# Patient Record
Sex: Female | Born: 1955
Health system: Southern US, Community
[De-identification: ages and names within clinical notes are randomized; demographics above are authoritative.]

## PROBLEM LIST (undated history)

## (undated) DIAGNOSIS — E079 Disorder of thyroid, unspecified: Secondary | ICD-10-CM

## (undated) DIAGNOSIS — K219 Gastro-esophageal reflux disease without esophagitis: Secondary | ICD-10-CM

## (undated) DIAGNOSIS — M8580 Other specified disorders of bone density and structure, unspecified site: Secondary | ICD-10-CM

## (undated) DIAGNOSIS — T7840XA Allergy, unspecified, initial encounter: Secondary | ICD-10-CM

## (undated) DIAGNOSIS — I10 Essential (primary) hypertension: Secondary | ICD-10-CM

## (undated) DIAGNOSIS — L309 Dermatitis, unspecified: Secondary | ICD-10-CM

## (undated) DIAGNOSIS — H8109 Meniere's disease, unspecified ear: Secondary | ICD-10-CM

## (undated) DIAGNOSIS — N951 Menopausal and female climacteric states: Secondary | ICD-10-CM

## (undated) DIAGNOSIS — F32A Depression, unspecified: Secondary | ICD-10-CM

## (undated) DIAGNOSIS — G4700 Insomnia, unspecified: Secondary | ICD-10-CM

## (undated) DIAGNOSIS — B019 Varicella without complication: Secondary | ICD-10-CM

## (undated) DIAGNOSIS — F329 Major depressive disorder, single episode, unspecified: Secondary | ICD-10-CM

## (undated) DIAGNOSIS — E7800 Pure hypercholesterolemia, unspecified: Secondary | ICD-10-CM

## (undated) HISTORY — DX: Menopausal and female climacteric states: N95.1

## (undated) HISTORY — DX: Varicella without complication: B01.9

## (undated) HISTORY — DX: Major depressive disorder, single episode, unspecified: F32.9

## (undated) HISTORY — DX: Dermatitis, unspecified: L30.9

## (undated) HISTORY — DX: Pure hypercholesterolemia, unspecified: E78.00

## (undated) HISTORY — DX: Disorder of thyroid, unspecified: E07.9

## (undated) HISTORY — DX: Meniere's disease, unspecified ear: H81.09

## (undated) HISTORY — DX: Gastro-esophageal reflux disease without esophagitis: K21.9

## (undated) HISTORY — DX: Depression, unspecified: F32.A

## (undated) HISTORY — DX: Essential (primary) hypertension: I10

## (undated) HISTORY — PX: WISDOM TOOTH EXTRACTION: SHX21

## (undated) HISTORY — DX: Other specified disorders of bone density and structure, unspecified site: M85.80

## (undated) HISTORY — DX: Allergy, unspecified, initial encounter: T78.40XA

## (undated) HISTORY — DX: Insomnia, unspecified: G47.00

## (undated) HISTORY — PX: COLONOSCOPY: SHX174

---

## 1998-10-22 ENCOUNTER — Other Ambulatory Visit: Admission: RE | Admit: 1998-10-22 | Discharge: 1998-10-22 | Payer: Self-pay | Admitting: Obstetrics and Gynecology

## 1998-11-18 ENCOUNTER — Encounter: Payer: Self-pay | Admitting: Obstetrics and Gynecology

## 1998-11-22 ENCOUNTER — Ambulatory Visit (HOSPITAL_COMMUNITY): Admission: RE | Admit: 1998-11-22 | Discharge: 1998-11-22 | Payer: Self-pay | Admitting: Obstetrics and Gynecology

## 1998-11-22 HISTORY — PX: TUBAL LIGATION: SHX77

## 1999-10-25 ENCOUNTER — Other Ambulatory Visit: Admission: RE | Admit: 1999-10-25 | Discharge: 1999-10-25 | Payer: Self-pay | Admitting: Obstetrics and Gynecology

## 1999-11-07 ENCOUNTER — Encounter: Payer: Self-pay | Admitting: Endocrinology

## 1999-11-07 ENCOUNTER — Ambulatory Visit (HOSPITAL_COMMUNITY): Admission: RE | Admit: 1999-11-07 | Discharge: 1999-11-07 | Payer: Self-pay | Admitting: Endocrinology

## 1999-12-16 ENCOUNTER — Encounter: Admission: RE | Admit: 1999-12-16 | Discharge: 1999-12-27 | Payer: Self-pay | Admitting: Neurological Surgery

## 2000-11-14 ENCOUNTER — Other Ambulatory Visit: Admission: RE | Admit: 2000-11-14 | Discharge: 2000-11-14 | Payer: Self-pay | Admitting: Obstetrics and Gynecology

## 2000-11-15 ENCOUNTER — Other Ambulatory Visit: Admission: RE | Admit: 2000-11-15 | Discharge: 2000-11-15 | Payer: Self-pay | Admitting: Obstetrics and Gynecology

## 2000-11-15 ENCOUNTER — Encounter (INDEPENDENT_AMBULATORY_CARE_PROVIDER_SITE_OTHER): Payer: Self-pay

## 2001-07-16 ENCOUNTER — Encounter: Admission: RE | Admit: 2001-07-16 | Discharge: 2001-07-16 | Payer: Self-pay | Admitting: Endocrinology

## 2001-07-16 ENCOUNTER — Encounter: Payer: Self-pay | Admitting: Endocrinology

## 2001-11-22 ENCOUNTER — Other Ambulatory Visit: Admission: RE | Admit: 2001-11-22 | Discharge: 2001-11-22 | Payer: Self-pay | Admitting: Obstetrics and Gynecology

## 2002-07-17 ENCOUNTER — Encounter: Payer: Self-pay | Admitting: Endocrinology

## 2002-07-17 ENCOUNTER — Encounter: Admission: RE | Admit: 2002-07-17 | Discharge: 2002-07-17 | Payer: Self-pay | Admitting: Endocrinology

## 2002-11-27 ENCOUNTER — Other Ambulatory Visit: Admission: RE | Admit: 2002-11-27 | Discharge: 2002-11-27 | Payer: Self-pay | Admitting: Obstetrics and Gynecology

## 2003-08-06 ENCOUNTER — Encounter: Admission: RE | Admit: 2003-08-06 | Discharge: 2003-08-06 | Payer: Self-pay | Admitting: Obstetrics and Gynecology

## 2003-10-09 ENCOUNTER — Other Ambulatory Visit: Admission: RE | Admit: 2003-10-09 | Discharge: 2003-10-09 | Payer: Self-pay | Admitting: Obstetrics and Gynecology

## 2004-08-10 ENCOUNTER — Encounter: Admission: RE | Admit: 2004-08-10 | Discharge: 2004-08-10 | Payer: Self-pay | Admitting: Obstetrics and Gynecology

## 2004-10-11 ENCOUNTER — Other Ambulatory Visit: Admission: RE | Admit: 2004-10-11 | Discharge: 2004-10-11 | Payer: Self-pay | Admitting: Addiction Medicine

## 2005-08-28 ENCOUNTER — Encounter: Admission: RE | Admit: 2005-08-28 | Discharge: 2005-08-28 | Payer: Self-pay | Admitting: Obstetrics and Gynecology

## 2005-10-17 ENCOUNTER — Other Ambulatory Visit: Admission: RE | Admit: 2005-10-17 | Discharge: 2005-10-17 | Payer: Self-pay | Admitting: Obstetrics and Gynecology

## 2006-08-29 ENCOUNTER — Encounter: Admission: RE | Admit: 2006-08-29 | Discharge: 2006-08-29 | Payer: Self-pay | Admitting: Obstetrics and Gynecology

## 2006-10-23 ENCOUNTER — Other Ambulatory Visit: Admission: RE | Admit: 2006-10-23 | Discharge: 2006-10-23 | Payer: Self-pay | Admitting: Obstetrics and Gynecology

## 2007-09-02 ENCOUNTER — Encounter: Admission: RE | Admit: 2007-09-02 | Discharge: 2007-09-02 | Payer: Self-pay | Admitting: Obstetrics and Gynecology

## 2007-09-10 ENCOUNTER — Encounter: Admission: RE | Admit: 2007-09-10 | Discharge: 2007-09-10 | Payer: Self-pay | Admitting: Obstetrics and Gynecology

## 2007-10-24 ENCOUNTER — Other Ambulatory Visit: Admission: RE | Admit: 2007-10-24 | Discharge: 2007-10-24 | Payer: Self-pay | Admitting: Obstetrics and Gynecology

## 2008-09-02 ENCOUNTER — Encounter: Admission: RE | Admit: 2008-09-02 | Discharge: 2008-09-02 | Payer: Self-pay | Admitting: Obstetrics and Gynecology

## 2008-09-24 ENCOUNTER — Encounter: Admission: RE | Admit: 2008-09-24 | Discharge: 2008-09-24 | Payer: Self-pay | Admitting: Otolaryngology

## 2008-10-26 ENCOUNTER — Other Ambulatory Visit: Admission: RE | Admit: 2008-10-26 | Discharge: 2008-10-26 | Payer: Self-pay | Admitting: Obstetrics and Gynecology

## 2008-10-26 ENCOUNTER — Ambulatory Visit: Payer: Self-pay | Admitting: Obstetrics and Gynecology

## 2008-10-26 ENCOUNTER — Encounter: Payer: Self-pay | Admitting: Obstetrics and Gynecology

## 2008-11-13 ENCOUNTER — Ambulatory Visit: Payer: Self-pay | Admitting: Obstetrics and Gynecology

## 2009-09-03 ENCOUNTER — Encounter: Admission: RE | Admit: 2009-09-03 | Discharge: 2009-09-03 | Payer: Self-pay | Admitting: Obstetrics and Gynecology

## 2009-11-02 ENCOUNTER — Ambulatory Visit: Payer: Self-pay | Admitting: Obstetrics and Gynecology

## 2009-11-02 ENCOUNTER — Other Ambulatory Visit: Admission: RE | Admit: 2009-11-02 | Discharge: 2009-11-02 | Payer: Self-pay | Admitting: Obstetrics and Gynecology

## 2009-12-21 ENCOUNTER — Ambulatory Visit: Payer: Self-pay | Admitting: Obstetrics and Gynecology

## 2010-04-26 ENCOUNTER — Ambulatory Visit: Payer: Self-pay | Admitting: Obstetrics and Gynecology

## 2010-08-01 ENCOUNTER — Other Ambulatory Visit: Payer: Self-pay | Admitting: Obstetrics and Gynecology

## 2010-08-01 DIAGNOSIS — Z1239 Encounter for other screening for malignant neoplasm of breast: Secondary | ICD-10-CM

## 2010-09-06 ENCOUNTER — Ambulatory Visit
Admission: RE | Admit: 2010-09-06 | Discharge: 2010-09-06 | Disposition: A | Discharge status: Home / Self Care | Payer: 59 | Source: Ambulatory Visit | Attending: Obstetrics and Gynecology | Admitting: Obstetrics and Gynecology

## 2010-09-06 DIAGNOSIS — Z1239 Encounter for other screening for malignant neoplasm of breast: Secondary | ICD-10-CM

## 2010-11-07 ENCOUNTER — Encounter (INDEPENDENT_AMBULATORY_CARE_PROVIDER_SITE_OTHER): Payer: 59 | Admitting: Obstetrics and Gynecology

## 2010-11-07 ENCOUNTER — Other Ambulatory Visit (HOSPITAL_COMMUNITY)
Admission: RE | Admit: 2010-11-07 | Discharge: 2010-11-07 | Disposition: A | Discharge status: Home / Self Care | Payer: 59 | Source: Ambulatory Visit | Attending: Obstetrics and Gynecology | Admitting: Obstetrics and Gynecology

## 2010-11-07 ENCOUNTER — Other Ambulatory Visit: Payer: Self-pay | Admitting: Obstetrics and Gynecology

## 2010-11-07 DIAGNOSIS — Z3049 Encounter for surveillance of other contraceptives: Secondary | ICD-10-CM

## 2010-11-07 DIAGNOSIS — M949 Disorder of cartilage, unspecified: Secondary | ICD-10-CM

## 2010-11-07 DIAGNOSIS — M899 Disorder of bone, unspecified: Secondary | ICD-10-CM

## 2010-11-07 DIAGNOSIS — Z124 Encounter for screening for malignant neoplasm of cervix: Secondary | ICD-10-CM | POA: Insufficient documentation

## 2011-08-08 ENCOUNTER — Other Ambulatory Visit: Payer: Self-pay | Admitting: Obstetrics and Gynecology

## 2011-08-08 DIAGNOSIS — Z1231 Encounter for screening mammogram for malignant neoplasm of breast: Secondary | ICD-10-CM

## 2011-09-08 ENCOUNTER — Ambulatory Visit
Admission: RE | Admit: 2011-09-08 | Discharge: 2011-09-08 | Disposition: A | Discharge status: Home / Self Care | Payer: Managed Care, Other (non HMO) | Source: Ambulatory Visit | Attending: Obstetrics and Gynecology | Admitting: Obstetrics and Gynecology

## 2011-09-08 DIAGNOSIS — Z1231 Encounter for screening mammogram for malignant neoplasm of breast: Secondary | ICD-10-CM

## 2011-11-01 ENCOUNTER — Encounter: Payer: Self-pay | Admitting: Obstetrics and Gynecology

## 2011-11-01 DIAGNOSIS — F329 Major depressive disorder, single episode, unspecified: Secondary | ICD-10-CM | POA: Insufficient documentation

## 2011-11-01 DIAGNOSIS — N951 Menopausal and female climacteric states: Secondary | ICD-10-CM | POA: Insufficient documentation

## 2011-11-01 DIAGNOSIS — F32A Depression, unspecified: Secondary | ICD-10-CM | POA: Insufficient documentation

## 2011-11-01 DIAGNOSIS — G4700 Insomnia, unspecified: Secondary | ICD-10-CM | POA: Insufficient documentation

## 2011-11-01 DIAGNOSIS — M8580 Other specified disorders of bone density and structure, unspecified site: Secondary | ICD-10-CM | POA: Insufficient documentation

## 2011-11-08 ENCOUNTER — Ambulatory Visit (INDEPENDENT_AMBULATORY_CARE_PROVIDER_SITE_OTHER): Payer: Managed Care, Other (non HMO) | Admitting: Obstetrics and Gynecology

## 2011-11-08 ENCOUNTER — Encounter: Payer: Self-pay | Admitting: Obstetrics and Gynecology

## 2011-11-08 VITALS — BP 124/76 | Ht 61.0 in | Wt 156.0 lb

## 2011-11-08 DIAGNOSIS — Z01419 Encounter for gynecological examination (general) (routine) without abnormal findings: Secondary | ICD-10-CM

## 2011-11-08 DIAGNOSIS — E079 Disorder of thyroid, unspecified: Secondary | ICD-10-CM | POA: Insufficient documentation

## 2011-11-08 DIAGNOSIS — E7800 Pure hypercholesterolemia, unspecified: Secondary | ICD-10-CM | POA: Insufficient documentation

## 2011-11-08 DIAGNOSIS — E785 Hyperlipidemia, unspecified: Secondary | ICD-10-CM | POA: Insufficient documentation

## 2011-11-08 DIAGNOSIS — E039 Hypothyroidism, unspecified: Secondary | ICD-10-CM | POA: Insufficient documentation

## 2011-11-08 NOTE — Progress Notes (Signed)
Patient came to see me today for her annual GYN exam. Her hot flashes have subsided and she really has one. She does have some vaginal dryness but does not require intervention. She is not sexually active. She is up-to-date on mammograms and colonoscopy. She does her lab through PCP.  she has low bone mass without an elevated FRAX risk. She is due for follow bone density this year. She has had no fractures. She has no vaginal bleeding. She has no pelvic pain.  Physical examination: Kennon Portela present. HEENT within normal limits. Neck: Thyroid not large. No masses. Supraclavicular nodes: not enlarged. Breasts: Examined in both sitting and lying  position. No skin changes and no masses. Abdomen: Soft no guarding rebound or masses or hernia. Pelvic: External: Within normal limits. BUS: Within normal limits. Vaginal:within normal limits. Fair  estrogen effect. No evidence of cystocele rectocele or enterocele. Cervix: clean. Uterus: Normal size and shape. Adnexa: No masses. Rectovaginal exam: Confirmatory and negative. Extremities: Within normal limits.  Assessment: #1. Atrophic vaginitis-mild #2. Osteopenia  Plan: Continue yearly mammograms. Bone density this year.

## 2011-11-09 LAB — URINALYSIS W MICROSCOPIC + REFLEX CULTURE
Bacteria, UA: NONE SEEN
Bilirubin Urine: NEGATIVE
Casts: NONE SEEN
Crystals: NONE SEEN
Glucose, UA: NEGATIVE mg/dL
Hgb urine dipstick: NEGATIVE
Ketones, ur: NEGATIVE mg/dL
Leukocytes, UA: NEGATIVE
Nitrite: NEGATIVE
Protein, ur: NEGATIVE mg/dL
pH: 7.5 (ref 5.0–8.0)

## 2011-12-20 ENCOUNTER — Other Ambulatory Visit: Payer: Self-pay | Admitting: Obstetrics and Gynecology

## 2011-12-20 DIAGNOSIS — M8580 Other specified disorders of bone density and structure, unspecified site: Secondary | ICD-10-CM

## 2011-12-26 ENCOUNTER — Ambulatory Visit (INDEPENDENT_AMBULATORY_CARE_PROVIDER_SITE_OTHER): Payer: Managed Care, Other (non HMO)

## 2011-12-26 DIAGNOSIS — M899 Disorder of bone, unspecified: Secondary | ICD-10-CM

## 2011-12-26 DIAGNOSIS — M8580 Other specified disorders of bone density and structure, unspecified site: Secondary | ICD-10-CM

## 2012-01-02 ENCOUNTER — Encounter: Payer: Self-pay | Admitting: Obstetrics and Gynecology

## 2012-07-12 ENCOUNTER — Other Ambulatory Visit: Payer: Self-pay | Admitting: Obstetrics and Gynecology

## 2012-09-03 ENCOUNTER — Other Ambulatory Visit: Payer: Self-pay | Admitting: Gynecology

## 2012-09-03 DIAGNOSIS — Z1231 Encounter for screening mammogram for malignant neoplasm of breast: Secondary | ICD-10-CM

## 2012-09-26 ENCOUNTER — Ambulatory Visit
Admission: RE | Admit: 2012-09-26 | Discharge: 2012-09-26 | Disposition: A | Discharge status: Home / Self Care | Payer: Managed Care, Other (non HMO) | Source: Ambulatory Visit | Attending: Gynecology | Admitting: Gynecology

## 2012-09-26 DIAGNOSIS — Z1231 Encounter for screening mammogram for malignant neoplasm of breast: Secondary | ICD-10-CM

## 2012-11-08 ENCOUNTER — Encounter: Payer: Self-pay | Admitting: Gynecology

## 2012-11-08 ENCOUNTER — Ambulatory Visit (INDEPENDENT_AMBULATORY_CARE_PROVIDER_SITE_OTHER): Payer: Managed Care, Other (non HMO) | Admitting: Gynecology

## 2012-11-08 VITALS — BP 126/74 | Ht 61.25 in | Wt 166.0 lb

## 2012-11-08 DIAGNOSIS — M949 Disorder of cartilage, unspecified: Secondary | ICD-10-CM

## 2012-11-08 DIAGNOSIS — M8580 Other specified disorders of bone density and structure, unspecified site: Secondary | ICD-10-CM

## 2012-11-08 DIAGNOSIS — Z01419 Encounter for gynecological examination (general) (routine) without abnormal findings: Secondary | ICD-10-CM

## 2012-11-08 DIAGNOSIS — Z1159 Encounter for screening for other viral diseases: Secondary | ICD-10-CM

## 2012-11-08 DIAGNOSIS — M899 Disorder of bone, unspecified: Secondary | ICD-10-CM

## 2012-11-08 NOTE — Patient Instructions (Signed)

## 2012-11-08 NOTE — Progress Notes (Signed)
Bianca Lynch Dec 09, 1955 [id number]   History:    57 y.o.  for annual gyn exam with no complaints today. Patient has never been on hormone replacement therapy. She has been in menopause for several years. Dr. Wylene Simmer is her primary physician who has been doing her lab work and treating her for hyperlipidemia and hypothyroidism. Patient would know prior history of abnormal Pap smear. Last Pap smear was normal in 2012. Her colonoscopy was reported to be normal 8 years ago. Her last bone density study in 2013 with osteopenia lowest T score in the right femoral neck -1.1.  Past medical history,surgical history, family history and social history were all reviewed and documented in the EPIC chart.  Gynecologic History No LMP recorded. Patient is postmenopausal. Contraception: post menopausal status and tubal ligation Last Pap: 2012. Results were: normal Last mammogram: 2014. Results were: normal  Obstetric History OB History   Grav Para Term Preterm Abortions TAB SAB Ect Mult Living   0                ROS: A ROS was performed and pertinent positives and negatives are included in the history.  GENERAL: No fevers or chills. HEENT: No change in vision, no earache, sore throat or sinus congestion. NECK: No pain or stiffness. CARDIOVASCULAR: No chest pain or pressure. No palpitations. PULMONARY: No shortness of breath, cough or wheeze. GASTROINTESTINAL: No abdominal pain, nausea, vomiting or diarrhea, melena or bright red blood per rectum. GENITOURINARY: No urinary frequency, urgency, hesitancy or dysuria. MUSCULOSKELETAL: No joint or muscle pain, no back pain, no recent trauma. DERMATOLOGIC: No rash, no itching, no lesions. ENDOCRINE: No polyuria, polydipsia, no heat or cold intolerance. No recent change in weight. HEMATOLOGICAL: No anemia or easy bruising or bleeding. NEUROLOGIC: No headache, seizures, numbness, tingling or weakness. PSYCHIATRIC: No depression, no loss of interest in normal activity or  change in sleep pattern.     Exam: chaperone present  BP 126/74  Ht 5' 1.25" (1.556 m)  Wt 166 lb (75.297 kg)  BMI 31.1 kg/m2  Body mass index is 31.1 kg/(m^2).  General appearance : Well developed well nourished female. No acute distress HEENT: Neck supple, trachea midline, no carotid bruits, no thyroidmegaly Lungs: Clear to auscultation, no rhonchi or wheezes, or rib retractions  Heart: Regular rate and rhythm, no murmurs or gallops Breast:Examined in sitting and supine position were symmetrical in appearance, no palpable masses or tenderness,  no skin retraction, no nipple inversion, no nipple discharge, no skin discoloration, no axillary or supraclavicular lymphadenopathy Abdomen: no palpable masses or tenderness, no rebound or guarding Extremities: no edema or skin discoloration or tenderness  Pelvic:  Bartholin, Urethra, Skene Glands: Within normal limits             Vagina: No gross lesions or discharge  Cervix: No gross lesions or discharge  Uterus  anteverted, normal size, shape and consistency, non-tender and mobile  Adnexa  Without masses or tenderness  Anus and perineum  normal   Rectovaginal  normal sphincter tone without palpated masses or tenderness             Hemoccult primary physician will be provided her with cards     Assessment/Plan:  57 y.o. female for annual exam who is postmenopausal with no vasomotor symptoms no prior history of hormone replacement therapy. Patient not sexually active. She was instructed take calcium and vitamin D for osteoporosis prevention. She is due for bone density study next year. She will check with  her primary physician in reference to her Tdap status. I've given her prescription for her to obtain her shingles vaccine.  New CDC guidelines is recommending patients be tested once in her lifetime for hepatitis C antibody who were born between [birth year] through 1965. This was discussed with the patient today and has agreed to be tested  today.    Ok Edwards MD, 12:21 PM 11/08/2012

## 2012-11-09 LAB — HEPATITIS C ANTIBODY: HCV Ab: NEGATIVE

## 2013-08-22 ENCOUNTER — Other Ambulatory Visit: Payer: Self-pay

## 2013-08-22 DIAGNOSIS — Z1231 Encounter for screening mammogram for malignant neoplasm of breast: Secondary | ICD-10-CM

## 2013-10-10 ENCOUNTER — Ambulatory Visit
Admission: RE | Admit: 2013-10-10 | Discharge: 2013-10-10 | Disposition: A | Discharge status: Home / Self Care | Payer: Managed Care, Other (non HMO) | Source: Ambulatory Visit

## 2013-10-10 DIAGNOSIS — Z1231 Encounter for screening mammogram for malignant neoplasm of breast: Secondary | ICD-10-CM

## 2013-11-11 ENCOUNTER — Ambulatory Visit (INDEPENDENT_AMBULATORY_CARE_PROVIDER_SITE_OTHER): Payer: Managed Care, Other (non HMO) | Admitting: Gynecology

## 2013-11-11 ENCOUNTER — Encounter: Payer: Self-pay | Admitting: Gynecology

## 2013-11-11 ENCOUNTER — Other Ambulatory Visit (HOSPITAL_COMMUNITY)
Admission: RE | Admit: 2013-11-11 | Discharge: 2013-11-11 | Disposition: A | Discharge status: Home / Self Care | Payer: Managed Care, Other (non HMO) | Source: Ambulatory Visit | Attending: Gynecology | Admitting: Gynecology

## 2013-11-11 VITALS — BP 130/82 | Ht 60.75 in | Wt 178.3 lb

## 2013-11-11 DIAGNOSIS — R87810 Cervical high risk human papillomavirus (HPV) DNA test positive: Secondary | ICD-10-CM | POA: Insufficient documentation

## 2013-11-11 DIAGNOSIS — M949 Disorder of cartilage, unspecified: Secondary | ICD-10-CM

## 2013-11-11 DIAGNOSIS — N951 Menopausal and female climacteric states: Secondary | ICD-10-CM

## 2013-11-11 DIAGNOSIS — M899 Disorder of bone, unspecified: Secondary | ICD-10-CM

## 2013-11-11 DIAGNOSIS — Z01419 Encounter for gynecological examination (general) (routine) without abnormal findings: Secondary | ICD-10-CM

## 2013-11-11 DIAGNOSIS — Z23 Encounter for immunization: Secondary | ICD-10-CM

## 2013-11-11 DIAGNOSIS — Z1151 Encounter for screening for human papillomavirus (HPV): Secondary | ICD-10-CM | POA: Insufficient documentation

## 2013-11-11 DIAGNOSIS — Z124 Encounter for screening for malignant neoplasm of cervix: Secondary | ICD-10-CM | POA: Insufficient documentation

## 2013-11-11 DIAGNOSIS — M8580 Other specified disorders of bone density and structure, unspecified site: Secondary | ICD-10-CM

## 2013-11-11 DIAGNOSIS — Z780 Asymptomatic menopausal state: Secondary | ICD-10-CM

## 2013-11-11 NOTE — Patient Instructions (Addendum)
Tetanus, Diphtheria (Td) Vaccine What You Need to Know WHY GET VACCINATED? Tetanus  and diphtheria are very serious diseases. They are rare in the [place] today, but people who do become infected often have severe complications. Td vaccine is used to protect adolescents and adults from both of these diseases. Both tetanus and diphtheria are infections caused by bacteria. Diphtheria spreads from person to person through coughing or sneezing. Tetanus-causing bacteria enter the body through cuts, scratches, or wounds. TETANUS (Lockjaw) causes painful muscle tightening and stiffness, usually all over the body.  It can lead to tightening of muscles in the head and neck so you can't open your mouth, swallow, or sometimes even breathe. Tetanus kills about 1 out of every 5 people who are infected. DIPHTHERIA can cause a thick coating to form in the back of the throat.  It can lead to breathing problems, paralysis, heart failure, and death. Before vaccines, the [place] saw as many as 200,000 cases a year of diphtheria and hundreds of cases of tetanus. Since vaccination began, cases of both diseases have dropped by about 99%. TD VACCINE Td vaccine can protect adolescents and adults from tetanus and diphtheria. Td is usually given as a booster dose every 10 years but it can also be given earlier after a severe and dirty wound or burn. Your doctor can give you more information. Td may safely be given at the same time as other vaccines. SOME PEOPLE SHOULD NOT GET THIS VACCINE  If you ever had a life-threatening allergic reaction after a dose of any tetanus or diphtheria containing vaccine, OR if you have a severe allergy to any part of this vaccine, you should not get Td. Tell your doctor if you have any severe allergies.  Talk to your doctor if you:  have epilepsy or another nervous system problem,  had severe pain or swelling after any vaccine containing diphtheria or tetanus,  ever had  Guillain Barr Syndrome (GBS),  aren't feeling well on the day the shot is scheduled. RISKS OF A VACCINE REACTION With a vaccine, like any medicine, there is a chance of side effects. These are usually mild and go away on their own. Serious side effects are also possible, but are very rare. Most people who get Td vaccine do not have any problems with it. Mild Problems  following Td (Did not interfere with activities)  Pain where the shot was given (about 8 people in 10)  Redness or swelling where the shot was given (about 1 person in 3)  Mild fever (about 1 person in 15)  Headache or Tiredness (uncommon) Moderate Problems following Td (Interfered with activities, but did not require medical attention)  Fever over 102 F (38.9 C) (rare) Severe Problems  following Td (Unable to perform usual activities; required medical attention)  Swelling, severe pain, bleeding, or redness in the arm where the shot was given (rare). Problems that could happen after any vaccine:  Brief fainting spells can happen after any medical procedure, including vaccination. Sitting or lying down for about 15 minutes can help prevent fainting, and injuries caused by a fall. Tell your doctor if you feel dizzy, or have vision changes or ringing in the ears.  Severe shoulder pain and reduced range of motion in the arm where a shot was given can happen, very rarely, after a vaccination.  Severe allergic reactions from a vaccine are very rare, estimated at less than 1 in a million doses. If one were to occur, it would   usually be within a few minutes to a few hours after the vaccination. WHAT IF THERE IS A SERIOUS REACTION? What should I look for?  Look for anything that concerns you, such as signs of a severe allergic reaction, very high fever, or behavior changes. Signs of a severe allergic reaction can include hives, swelling of the face and throat, difficulty breathing, a fast heartbeat, dizziness, and  weakness. These would usually start a few minutes to a few hours after the vaccination. What should I do?  If you think it is a severe allergic reaction or other emergency that can't wait, call 911 or get the person to the nearest hospital. Otherwise, call your doctor.  Afterward, the reaction should be reported to the Vaccine Adverse Event Reporting System (VAERS). Your doctor might file this report, or, you can do it yourself through the VAERS website or by calling (820)486-8570. VAERS is only for reporting reactions. They do not give medical advice. THE NATIONAL VACCINE INJURY COMPENSATION PROGRAM The National Vaccine Injury Compensation Program (VICP) is a federal program that was created to compensate people who may have been injured by certain vaccines. Persons who believe they may have been injured by a vaccine can learn about the program and about filing a claim by calling (906)828-9843 or visiting the [facility] website. HOW CAN I LEARN MORE?  Ask your doctor.  Contact your local or state health department.  Contact the Centers for Disease Control and Prevention (CDC):  Call 516-597-4044 (1-800-CDC-INFO)  Visit CDC's vaccines website CDC Td Vaccine Interim VIS (08/13/12) Document Released: 04/23/2006 Document Revised: 10/21/2012 Document Reviewed: 10/16/2012 [facility] Patient Information 2014 [place], [place]. Bone Densitometry Bone densitometry is a special X-ray that measures your bone density and can be used to help predict your risk of bone fractures. This test is used to determine bone mineral content and density to diagnose osteoporosis. Osteoporosis is the loss of bone that may cause the bone to become weak. Osteoporosis commonly occurs in women entering menopause. However, it may be found in men and in people with other diseases. PREPARATION FOR TEST No preparation necessary. WHO SHOULD BE TESTED?  All women older than 58.  Postmenopausal women (50 to 19) with risk factors  for osteoporosis.  People with a previous fracture caused by normal activities.  People with a small body frame (less than 127 poundsor a body mass index [BMI] of less than 21).  People who have a parent with a hip fracture or history of osteoporosis.  People who smoke.  People who have rheumatoid arthritis.  Anyone who engages in excessive alcohol use (more than 3 drinks most days).  Women who experience early menopause. WHEN SHOULD YOU BE RETESTED? Current guidelines suggest that you should wait at least 2 years before doing a bone density test again if your first test was normal.Recent studies indicated that women with normal bone density may be able to wait a few years before needing to repeat a bone density test. You should discuss this with your caregiver.  NORMAL FINDINGS   Normal: less than standard deviation below normal (greater than -1).  Osteopenia: 1 to 2.5 standard deviations below normal (-1 to -2.5).  Osteoporosis: greater than 2.5 standard deviations below normal (less than -2.5). Test results are reported as a "T score" and a "Z score."The T score is a number that compares your bone density with the bone density of healthy, young women.The Z score is a number that compares your bone density with the scores of  women who are the same age, gender, and race.  Ranges for normal findings may vary among different laboratories and hospitals. You should always check with your doctor after having lab work or other tests done to discuss the meaning of your test results and whether your values are considered within normal limits. MEANING OF TEST  Your caregiver will go over the test results with you and discuss the importance and meaning of your results, as well as treatment options and the need for additional tests if necessary. OBTAINING THE TEST RESULTS It is your responsibility to obtain your test results. Ask the lab or department performing the test when and how you will  get your results. Document Released: 07/18/2004 Document Revised: 09/18/2011 Document Reviewed: 08/10/2010 [facility] Patient Information 2014 [place].

## 2013-11-11 NOTE — Progress Notes (Signed)
Bianca Lynch [birth year] [id number]   History:    58 y.o.  for annual gyn exam with no complaints today.Patient has never been on hormone replacement therapy. She has been in menopause for several years.  Patient is now seeing a new PCP in [place], [place] who has been doing her blood work. Patient with no prior history of abnormal Pap smear. Last Pap smear was normal 2004. Her last colonoscopy was normal 9 years ago.Her last bone density study in 2013 with osteopenia lowest T score in the right femoral neck -1.1. Patient received her she will fax the last year. Patient to receive the Tdap vaccine today. Patient with prior tubal sterilization procedure. Patient having no menses or vasomotor symptoms.   Past medical history,surgical history, family history and social history were all reviewed and documented in the EPIC chart.  Gynecologic History No LMP recorded. Patient is postmenopausal. Contraception: post menopausal status and tubal ligation Last Pap: 2012. Results were: normal Last mammogram:  2015. Results were: normal  Obstetric History OB History  Gravida Para Term Preterm AB SAB TAB Ectopic Multiple Living  0                  ROS: A ROS was performed and pertinent positives and negatives are included in the history.  GENERAL: No fevers or chills. HEENT: No change in vision, no earache, sore throat or sinus congestion. NECK: No pain or stiffness. CARDIOVASCULAR: No chest pain or pressure. No palpitations. PULMONARY: No shortness of breath, cough or wheeze. GASTROINTESTINAL: No abdominal pain, nausea, vomiting or diarrhea, melena or bright red blood per rectum. GENITOURINARY: No urinary frequency, urgency, hesitancy or dysuria. MUSCULOSKELETAL: No joint or muscle pain, no back pain, no recent trauma. DERMATOLOGIC: No rash, no itching, no lesions. ENDOCRINE: No polyuria, polydipsia, no heat or cold intolerance. No recent change in weight. HEMATOLOGICAL: No anemia or easy  bruising or bleeding. NEUROLOGIC: No headache, seizures, numbness, tingling or weakness. PSYCHIATRIC: No depression, no loss of interest in normal activity or change in sleep pattern.     Exam: chaperone present  BP 130/82  Ht 5' 0.75" (1.543 m)  Wt 178 lb 4.8 oz (80.876 kg)  BMI 33.97 kg/m2  Body mass index is 33.97 kg/(m^2).  General appearance : Well developed well nourished female. No acute distress HEENT: Neck supple, trachea midline, no carotid bruits, no thyroidmegaly Lungs: Clear to auscultation, no rhonchi or wheezes, or rib retractions  Heart: Regular rate and rhythm, no murmurs or gallops Breast:Examined in sitting and supine position were symmetrical in appearance, no palpable masses or tenderness,  no skin retraction, no nipple inversion, no nipple discharge, no skin discoloration, no axillary or supraclavicular lymphadenopathy Abdomen: no palpable masses or tenderness, no rebound or guarding Extremities: no edema or skin discoloration or tenderness  Pelvic:  Bartholin, Urethra, Skene Glands: Within normal limits             Vagina: No gross lesions or discharge  Cervix: No gross lesions or discharge  Uterus  anteverted, normal size, shape and consistency, non-tender and mobile  Adnexa  Without masses or tenderness  Anus and perineum  normal   Rectovaginal  normal sphincter tone without palpated masses or tenderness             Hemoccult cards provided     Assessment/Plan:  58 y.o. female for annual exam postmenopausal with no visual motor symptoms. PCP doing her blood work. Patient due for colonoscopy next year. A requisition  was provided for her to schedule her bone density study here in our office in the next few weeks and also to bring with her at the same time her fecal Hemoccult cards for testing. She was reminded on the importance of monthly breast exams. She received the Tdap vaccine today. Pap smear was done today in accordance to new guidelines.  Note: This  dictation was prepared with  Dragon/digital dictation along withSmart phrase technology. Any transcriptional errors that result from this process are unintentional.   Terrance Mass MD, 5:13 PM 11/11/2013

## 2013-12-09 ENCOUNTER — Other Ambulatory Visit: Payer: Self-pay | Admitting: Gynecology

## 2013-12-09 ENCOUNTER — Ambulatory Visit (INDEPENDENT_AMBULATORY_CARE_PROVIDER_SITE_OTHER): Payer: Managed Care, Other (non HMO)

## 2013-12-09 DIAGNOSIS — Z780 Asymptomatic menopausal state: Secondary | ICD-10-CM

## 2013-12-09 DIAGNOSIS — N951 Menopausal and female climacteric states: Secondary | ICD-10-CM

## 2013-12-09 DIAGNOSIS — Z13820 Encounter for screening for osteoporosis: Secondary | ICD-10-CM

## 2013-12-09 DIAGNOSIS — M8580 Other specified disorders of bone density and structure, unspecified site: Secondary | ICD-10-CM

## 2013-12-29 ENCOUNTER — Other Ambulatory Visit: Payer: Managed Care, Other (non HMO) | Admitting: Anesthesiology

## 2013-12-29 DIAGNOSIS — Z1211 Encounter for screening for malignant neoplasm of colon: Secondary | ICD-10-CM

## 2014-02-16 ENCOUNTER — Ambulatory Visit: Payer: Managed Care, Other (non HMO) | Admitting: Podiatry

## 2014-02-16 ENCOUNTER — Encounter: Payer: Self-pay | Admitting: Podiatry

## 2014-02-16 VITALS — BP 128/70 | HR 69 | Resp 12

## 2014-02-16 DIAGNOSIS — L600 Ingrowing nail: Secondary | ICD-10-CM

## 2014-02-16 NOTE — Patient Instructions (Signed)

## 2014-02-16 NOTE — Progress Notes (Signed)
Subjective:     Patient ID: Bianca Lynch, female   DOB: June 03, 1956, 58 y.o.   MRN: [id number]  HPI patient presents stating I have an ingrown toenail on my right big toe that I tried to trim and soak and I am not able to get better   Review of Systems  All other systems reviewed and are negative.      Objective:   Physical Exam  Nursing note and vitals reviewed. Constitutional: She is oriented to person, place, and time.  Cardiovascular: Intact distal pulses.   Musculoskeletal: Normal range of motion.  Neurological: She is oriented to person, place, and time.  Skin: Skin is warm.   Neurovascular status found to be intact with range of motion subtalar midtarsal joint within normal limits and muscle strength adequate. Patient is found to have incurvated ingrown toenail right big nail lateral side that is very painful when pressed. Digits are well-perfused and arch height was found to be normal    Assessment:     Ingrown toenail with incurvation lateral border right hallux    Plan:     H&P reviewed with patient and condition explained. I've recommended correction and I discussed with her the surgery to correct this and reviewed complications. Patient wants surgery and today I infiltrated 60 mg Xylocaine Marcaine mixture remove the lateral border exposed matrix and apply chemical phenol 3 applications 30 seconds followed by alcohol lavaged and sterile dressing. Patient was given instructions on soaks and reappoint

## 2014-02-16 NOTE — Progress Notes (Signed)
   Subjective:    Patient ID: Bianca Lynch, female    DOB: 02-Jun-1956, 58 y.o.   MRN: [id number]  HPI PT STATED RT FOOT GREAT TOENAIL IS BEEN SORE FOR ABOUT 1 MONTH. THE TOE IS BEEN THE SAME BUT NOT GETTING WORSE. THE TOE GET AGGRAVATED BY PRESSURE AND TRIED TO USED PEROXIDE AND NEOSPORIN BUT NO HELP.   Review of Systems  HENT: Positive for hearing loss.   All other systems reviewed and are negative.      Objective:   Physical Exam        Assessment & Plan:

## 2014-10-07 ENCOUNTER — Other Ambulatory Visit: Payer: Self-pay

## 2014-10-07 DIAGNOSIS — Z1231 Encounter for screening mammogram for malignant neoplasm of breast: Secondary | ICD-10-CM

## 2014-10-14 ENCOUNTER — Ambulatory Visit: Payer: Managed Care, Other (non HMO)

## 2014-10-21 ENCOUNTER — Ambulatory Visit
Admission: RE | Admit: 2014-10-21 | Discharge: 2014-10-21 | Disposition: A | Discharge status: Home / Self Care | Payer: Managed Care, Other (non HMO) | Source: Ambulatory Visit

## 2014-10-21 DIAGNOSIS — Z1231 Encounter for screening mammogram for malignant neoplasm of breast: Secondary | ICD-10-CM

## 2014-11-13 ENCOUNTER — Ambulatory Visit (INDEPENDENT_AMBULATORY_CARE_PROVIDER_SITE_OTHER): Payer: Managed Care, Other (non HMO) | Admitting: Gynecology

## 2014-11-13 ENCOUNTER — Other Ambulatory Visit (HOSPITAL_COMMUNITY)
Admission: RE | Admit: 2014-11-13 | Discharge: 2014-11-13 | Disposition: A | Discharge status: Home / Self Care | Payer: Managed Care, Other (non HMO) | Source: Ambulatory Visit | Attending: Gynecology | Admitting: Gynecology

## 2014-11-13 ENCOUNTER — Encounter: Payer: Self-pay | Admitting: Gynecology

## 2014-11-13 VITALS — BP 136/88 | Ht 60.5 in | Wt 180.0 lb

## 2014-11-13 DIAGNOSIS — R87810 Cervical high risk human papillomavirus (HPV) DNA test positive: Secondary | ICD-10-CM | POA: Diagnosis present

## 2014-11-13 DIAGNOSIS — Z780 Asymptomatic menopausal state: Secondary | ICD-10-CM | POA: Diagnosis not present

## 2014-11-13 DIAGNOSIS — Z1151 Encounter for screening for human papillomavirus (HPV): Secondary | ICD-10-CM | POA: Diagnosis present

## 2014-11-13 DIAGNOSIS — Z01419 Encounter for gynecological examination (general) (routine) without abnormal findings: Secondary | ICD-10-CM | POA: Insufficient documentation

## 2014-11-13 DIAGNOSIS — N942 Vaginismus: Secondary | ICD-10-CM | POA: Diagnosis not present

## 2014-11-13 NOTE — Patient Instructions (Signed)

## 2014-11-13 NOTE — Progress Notes (Signed)
Bianca Lynch 1955/08/11 [id number]   History:    59 y.o.  for annual gyn exam with no complaints today. Patient is menopausal has never been on hormone replacement therapy and no past history of any abnormal Pap smear until last year whereby her Pap smear was as follows:  Normal  Ancillary Testing HPV 16,18/45 Genotyping NEGATIVE for HPV 16 & 18/45 Completed by [id number] on 2013-11-17 HPV High Risk **DETECTED**  Patient's PCP is in [facility]. Patient 2011 her bone density study had demonstrated mild osteopenia but subsequent bone density studies have been normal including last year. Patient is now overdue for her colonoscopy her last one was 10 years ago and was reported to be normal.   Past medical history,surgical history, family history and social history were all reviewed and documented in the EPIC chart.  Gynecologic History No LMP recorded. Patient is postmenopausal. Contraception: post menopausal status Last Pap: 2015. Results were: normal Last mammo2016. Results were: normal  Obstetric History OB History  Gravida Para Term Preterm AB SAB TAB Ectopic Multiple Living  0                  ROS: A ROS was performed and pertinent positives and negatives are included in the history.  GENERAL: No fevers or chills. HEENT: No change in vision, no earache, sore throat or sinus congestion. NECK: No pain or stiffness. CARDIOVASCULAR: No chest pain or pressure. No palpitations. PULMONARY: No shortness of breath, cough or wheeze. GASTROINTESTINAL: No abdominal pain, nausea, vomiting or diarrhea, melena or bright red blood per rectum. GENITOURINARY: No urinary frequency, urgency, hesitancy or dysuria. MUSCULOSKELETAL: No joint or muscle pain, no back pain, no recent trauma. DERMATOLOGIC: No rash, no itching, no lesions. ENDOCRINE: No polyuria, polydipsia, no heat or cold intolerance. No recent change in weight. HEMATOLOGICAL: No anemia or easy bruising or bleeding.  NEUROLOGIC: No headache, seizures, numbness, tingling or weakness. PSYCHIATRIC: No depression, no loss of interest in normal activity or change in sleep pattern.     Exam: chaperone present  BP 136/88 mmHg  Ht 5' 0.5" (1.537 m)  Wt 180 lb (81.647 kg)  BMI 34.56 kg/m2  Body mass index is 34.56 kg/(m^2).  General appearance : Well developed well nourished female. No acute distress HEENT: Eyes: no retinal hemorrhage or exudates,  Neck supple, trachea midline, no carotid bruits, no thyroidmegaly Lungs: Clear to auscultation, no rhonchi or wheezes, or rib retractions  Heart: Regular rate and rhythm, no murmurs or gallops Breast:Examined in sitting and supine position were symmetrical in appearance, no palpable masses or tenderness,  no skin retraction, no nipple inversion, no nipple discharge, no skin discoloration, no axillary or supraclavicular lymphadenopathy Abdomen: no palpable masses or tenderness, no rebound or guarding Extremities: no edema or skin discoloration or tenderness  Pelvic:  Bartholin, Urethra, Skene Glands: Within normal limits             Vagina: No gross lesions or discharge  Cervix: No gross lesions or discharge  Uterus: Limited due to patient's vaginismus an abdominal girth              Adnexa: Same as above   Anus and perineum  normal   Rectovaginal  normal sphincter tone without palpated masses or tenderness             HemocCards provided    Assessment/Plan:  59 y.o. female for annual exam postmenopausal no prior history of HRT doing well. I have asked the patient  to return back to the office next week for an ultrasound for more thorough evaluation of her adnexa and uterus as a result of her abdominal girth/overweight and vaginismus. Pap smear with HPV screening was done today. She was reminded to do her monthly breast exam. We discussed importance of calcium vitamin D and regular exercise for osteoporosis prevention. She will need her bone density study next  year. She was reminded to bring to the office the fecal Hemoccult cards for testing. I've given the name of community gastroenterologist for her to schedule an appointment so she is overdue for her next colonoscopy.  Terrance Mass MD, 2:27 PM 11/13/2014

## 2014-11-17 LAB — CYTOLOGY - PAP

## 2014-11-18 ENCOUNTER — Encounter: Payer: Self-pay | Admitting: Gynecology

## 2014-11-18 ENCOUNTER — Other Ambulatory Visit: Payer: Self-pay | Admitting: Gynecology

## 2014-11-18 ENCOUNTER — Ambulatory Visit (INDEPENDENT_AMBULATORY_CARE_PROVIDER_SITE_OTHER): Payer: Managed Care, Other (non HMO)

## 2014-11-18 ENCOUNTER — Ambulatory Visit (INDEPENDENT_AMBULATORY_CARE_PROVIDER_SITE_OTHER): Payer: Managed Care, Other (non HMO) | Admitting: Gynecology

## 2014-11-18 VITALS — BP 140/86

## 2014-11-18 DIAGNOSIS — N8320 Unspecified ovarian cysts: Secondary | ICD-10-CM

## 2014-11-18 DIAGNOSIS — N839 Noninflammatory disorder of ovary, fallopian tube and broad ligament, unspecified: Secondary | ICD-10-CM

## 2014-11-18 DIAGNOSIS — N8331 Acquired atrophy of ovary: Secondary | ICD-10-CM

## 2014-11-18 DIAGNOSIS — Z780 Asymptomatic menopausal state: Secondary | ICD-10-CM

## 2014-11-18 DIAGNOSIS — R87618 Other abnormal cytological findings on specimens from cervix uteri: Secondary | ICD-10-CM | POA: Insufficient documentation

## 2014-11-18 DIAGNOSIS — N83319 Acquired atrophy of ovary, unspecified side: Secondary | ICD-10-CM

## 2014-11-18 DIAGNOSIS — N838 Other noninflammatory disorders of ovary, fallopian tube and broad ligament: Secondary | ICD-10-CM

## 2014-11-18 DIAGNOSIS — N942 Vaginismus: Secondary | ICD-10-CM

## 2014-11-18 DIAGNOSIS — N83201 Unspecified ovarian cyst, right side: Secondary | ICD-10-CM

## 2014-11-18 HISTORY — DX: Other abnormal cytological findings on specimens from cervix uteri: R87.618

## 2014-11-18 NOTE — Patient Instructions (Addendum)
Ovarian Cyst An ovarian cyst is a fluid-filled sac that forms on an ovary. The ovaries are small organs that produce eggs in women. Various types of cysts can form on the ovaries. Most are not cancerous. Many do not cause problems, and they often go away on their own. Some may cause symptoms and require treatment. Common types of ovarian cysts include:  Functional cysts--These cysts may occur every month during the menstrual cycle. This is normal. The cysts usually go away with the next menstrual cycle if the woman does not get pregnant. Usually, there are no symptoms with a functional cyst.  Endometrioma cysts--These cysts form from the tissue that lines the uterus. They are also called "chocolate cysts" because they become filled with blood that turns brown. This type of cyst can cause pain in the lower abdomen during intercourse and with your menstrual period.  Cystadenoma cysts--This type develops from the cells on the outside of the ovary. These cysts can get very big and cause lower abdomen pain and pain with intercourse. This type of cyst can twist on itself, cut off its blood supply, and cause severe pain. It can also easily rupture and cause a lot of pain.  Dermoid cysts--This type of cyst is sometimes found in both ovaries. These cysts may contain different kinds of body tissue, such as skin, teeth, hair, or cartilage. They usually do not cause symptoms unless they get very big.  Theca lutein cysts--These cysts occur when too much of a certain hormone (human chorionic gonadotropin) is produced and overstimulates the ovaries to produce an egg. This is most common after procedures used to assist with the conception of a baby (in vitro fertilization). CAUSES   Fertility drugs can cause a condition in which multiple large cysts are formed on the ovaries. This is called ovarian hyperstimulation syndrome.  A condition called polycystic ovary syndrome can cause hormonal imbalances that can lead to  nonfunctional ovarian cysts. SIGNS AND SYMPTOMS  Many ovarian cysts do not cause symptoms. If symptoms are present, they may include:  Pelvic pain or pressure.  Pain in the lower abdomen.  Pain during sexual intercourse.  Increasing girth (swelling) of the abdomen.  Abnormal menstrual periods.  Increasing pain with menstrual periods.  Stopping having menstrual periods without being pregnant. DIAGNOSIS  These cysts are commonly found during a routine or annual pelvic exam. Tests may be ordered to find out more about the cyst. These tests may include:  Ultrasound.  X-ray of the pelvis.  CT scan.  MRI.  Blood tests. TREATMENT  Many ovarian cysts go away on their own without treatment. Your health care provider may want to check your cyst regularly for 2-3 months to see if it changes. For women in menopause, it is particularly important to monitor a cyst closely because of the higher rate of ovarian cancer in menopausal women. When treatment is needed, it may include any of the following:  A procedure to drain the cyst (aspiration). This may be done using a long needle and ultrasound. It can also be done through a laparoscopic procedure. This involves using a thin, lighted tube with a tiny camera on the end (laparoscope) inserted through a small incision.  Surgery to remove the whole cyst. This may be done using laparoscopic surgery or an open surgery involving a larger incision in the lower abdomen.  Hormone treatment or birth control pills. These methods are sometimes used to help dissolve a cyst. HOME CARE INSTRUCTIONS   Only take over-the-counter   or prescription medicines as directed by your health care provider.  Follow up with your health care provider as directed.  Get regular pelvic exams and Pap tests. SEEK MEDICAL CARE IF:   Your periods are late, irregular, or painful, or they stop.  Your pelvic pain or abdominal pain does not go away.  Your abdomen becomes  larger or swollen.  You have pressure on your bladder or trouble emptying your bladder completely.  You have pain during sexual intercourse.  You have feelings of fullness, pressure, or discomfort in your stomach.  You lose weight for no apparent reason.  You feel generally ill.  You become constipated.  You lose your appetite.  You develop acne.  You have an increase in body and facial hair.  You are gaining weight, without changing your exercise and eating habits.  You think you are pregnant. SEEK IMMEDIATE MEDICAL CARE IF:   You have increasing abdominal pain.  You feel sick to your stomach (nauseous), and you throw up (vomit).  You develop a fever that comes on suddenly.  You have abdominal pain during a bowel movement.  Your menstrual periods become heavier than usual. MAKE SURE YOU:  Understand these instructions.  Will watch your condition.  Will get help right away if you are not doing well or get worse. Document Released: 06/26/2005 Document Revised: 07/01/2013 Document Reviewed: 03/03/2013 [facility] Patient Information 2015 [place], [place]. This information is not intended to replace advice given to you by your health care provider. Make sure you discuss any questions you have with your health care provider. Colposcopy Colposcopy is a procedure to examine your cervix and vagina, or the area around the outside of your vagina, for abnormalities or signs of disease. The procedure is done using a lighted microscope called a colposcope. Tissue samples may be collected during the colposcopy if your health care provider finds any unusual cells. A colposcopy may be done if a woman has:  An abnormal Pap test. A Pap test is a medical test done to evaluate cells that are on the surface of the cervix.  A Pap test result that is suggestive of human papillomavirus (HPV). This virus can cause genital warts and is linked to the development of cervical cancer.  A sore on her  cervix and the results of a Pap test were normal.  Genital warts on the cervix or in or around the outside of the vagina.  A mother who took the drug diethylstilbestrol (DES) while pregnant.  Painful intercourse.  Vaginal bleeding, especially after sexual intercourse. LET [facility] CARE PROVIDER KNOW ABOUT:  Any allergies you have.  All medicines you are taking, including vitamins, herbs, eye drops, creams, and over-the-counter medicines.  Previous problems you or members of your family have had with the use of anesthetics.  Any blood disorders you have.  Previous surgeries you have had.  Medical conditions you have. RISKS AND COMPLICATIONS Generally, a colposcopy is a safe procedure. However, as with any procedure, complications can occur. Possible complications include:  Bleeding.  Infection.  Missed lesions. BEFORE THE PROCEDURE   Tell your health care provider if you have your menstrual period. A colposcopy typically is not done during menstruation.  For 24 hours before the colposcopy, do not:  [place].  Use tampons.  Use medicines, creams, or suppositories in the vagina.  Have sexual intercourse. PROCEDURE  During the procedure, you will be lying on your back with your feet in foot rests (stirrups). A warm metal or plastic  instrument (speculum) will be placed in your vagina to keep it open and to allow the health care provider to see the cervix. The colposcope will be placed outside the vagina. It will be used to magnify and examine the cervix, vagina, and the area around the outside of the vagina. A small amount of liquid solution will be placed on the area that is to be viewed. This solution will make it easier to see the abnormal cells. Your health care provider will use tools to suck out mucus and cells from the canal of the cervix. Then he or she will record the location of the abnormal areas. If a biopsy is done during the procedure, a medicine will usually be  given to numb the area (local anesthetic). You may feel mild pain or cramping while the biopsy is done. After the procedure, tissue samples collected during the biopsy will be sent to a lab for analysis. AFTER THE PROCEDURE  You will be given instructions on when to follow up with your health care provider for your test results. It is important to keep your appointment. Document Released: 09/16/2002 Document Revised: 02/26/2013 Document Reviewed: 01/23/2013 [facility] Patient Information 2015 [place], [place]. This information is not intended to replace advice given to you by your health care provider. Make sure you discuss any questions you have with your CA-125 Tumor Marker CA 125 is a tumor marker that is used to help monitor the course of ovarian or endometrial cancer. PREPARATION FOR TEST No preparation is necessary. NORMAL FINDINGS Adults: 0-35 units/mL (0-35 kilounits)/L Ranges for normal findings may vary among different laboratories and hospitals. You should always check with your doctor after having lab work or other tests done to discuss the meaning of your test results and whether your values are considered within normal limits. MEANING OF TEST  Your caregiver will go over the test results with you and discuss the importance and meaning of your results, as well as treatment options and the need for additional tests if necessary. OBTAINING THE TEST RESULTS It is your responsibility to obtain your test results. Ask the lab or department performing the test when and how you will get your results. Document Released: 07/18/2004 Document Revised: 09/18/2011 Document Reviewed: 06/03/2008 [facility] Patient Information 2015 [place], [place]. This information is not intended to replace advice given to you by your health care provider. Make sure you discuss any questions you have with your health care provider. health care provider.

## 2014-11-18 NOTE — Progress Notes (Signed)
   Patient's a 59 year old who was seen in the office on May 6 of her annual gynecological examination patient was totally asymptomatic but due to her vaginismus and difficult pelvic exam she was instructed to come to the office today for an ultrasound. Also review of her record indicated that she has had abnormal Pap smears as follows:  2015: Pap: Normal Ancillary Testing HPV 16,18/45 Genotyping NEGATIVE for HPV 16 & 18/45 Completed by [id number] on 2013-11-17 HPV High Risk **DETECTED**  2016: Pap: Normal High-risk HPV detected  Ultrasound: Uterus measured 5.8 x 3.1 x 2.2 cm with endometrial stripe of 1.4 mm. A right solid echogenic focus measuring 13 x 10 x 10 mm avascular was noted. Arterial blood flow was evidence of both right and left ovary. Left ovary was normal. Ovaries were atrophic. No fluid in the cul-de-sac.  Assessment/plan: #1 Pap smear 2015 2016 normal but HPV detected. Patient will return back to the office next week for colposcopic evaluation. This recent Pap smear will going to request and HPV subtype and we'll discuss with the patient returns for further evaluation. #2 incidental finding of an echogenic focus in the right ovary measured 13 x 10 x 10 mm avascular was cleaned with CA 125 today and recommend follow-up ultrasound in 6 months.

## 2014-11-19 LAB — CA 125: CA 125: 8 U/mL (ref <35)

## 2014-11-27 ENCOUNTER — Encounter: Payer: Self-pay | Admitting: Gynecology

## 2014-11-27 ENCOUNTER — Ambulatory Visit (INDEPENDENT_AMBULATORY_CARE_PROVIDER_SITE_OTHER): Payer: Managed Care, Other (non HMO) | Admitting: Gynecology

## 2014-11-27 ENCOUNTER — Encounter (HOSPITAL_COMMUNITY): Payer: Self-pay | Admitting: Neurology

## 2014-11-27 ENCOUNTER — Emergency Department (HOSPITAL_COMMUNITY)
Admission: EM | Admit: 2014-11-27 | Discharge: 2014-11-27 | Disposition: A | Discharge status: Home / Self Care | Payer: Managed Care, Other (non HMO) | Attending: Emergency Medicine | Admitting: Emergency Medicine

## 2014-11-27 VITALS — BP 120/80

## 2014-11-27 DIAGNOSIS — S0992XA Unspecified injury of nose, initial encounter: Secondary | ICD-10-CM | POA: Insufficient documentation

## 2014-11-27 DIAGNOSIS — Z87891 Personal history of nicotine dependence: Secondary | ICD-10-CM | POA: Diagnosis not present

## 2014-11-27 DIAGNOSIS — R87618 Other abnormal cytological findings on specimens from cervix uteri: Secondary | ICD-10-CM

## 2014-11-27 DIAGNOSIS — Z8669 Personal history of other diseases of the nervous system and sense organs: Secondary | ICD-10-CM | POA: Insufficient documentation

## 2014-11-27 DIAGNOSIS — Z8742 Personal history of other diseases of the female genital tract: Secondary | ICD-10-CM | POA: Insufficient documentation

## 2014-11-27 DIAGNOSIS — Z79899 Other long term (current) drug therapy: Secondary | ICD-10-CM | POA: Diagnosis not present

## 2014-11-27 DIAGNOSIS — S0181XA Laceration without foreign body of other part of head, initial encounter: Secondary | ICD-10-CM | POA: Insufficient documentation

## 2014-11-27 DIAGNOSIS — Y998 Other external cause status: Secondary | ICD-10-CM | POA: Diagnosis not present

## 2014-11-27 DIAGNOSIS — E039 Hypothyroidism, unspecified: Secondary | ICD-10-CM | POA: Diagnosis not present

## 2014-11-27 DIAGNOSIS — F329 Major depressive disorder, single episode, unspecified: Secondary | ICD-10-CM | POA: Diagnosis not present

## 2014-11-27 DIAGNOSIS — Y9289 Other specified places as the place of occurrence of the external cause: Secondary | ICD-10-CM | POA: Insufficient documentation

## 2014-11-27 DIAGNOSIS — M8580 Other specified disorders of bone density and structure, unspecified site: Secondary | ICD-10-CM | POA: Insufficient documentation

## 2014-11-27 DIAGNOSIS — W01198A Fall on same level from slipping, tripping and stumbling with subsequent striking against other object, initial encounter: Secondary | ICD-10-CM | POA: Insufficient documentation

## 2014-11-27 DIAGNOSIS — E780 Pure hypercholesterolemia: Secondary | ICD-10-CM | POA: Insufficient documentation

## 2014-11-27 DIAGNOSIS — Y9389 Activity, other specified: Secondary | ICD-10-CM | POA: Insufficient documentation

## 2014-11-27 MED ORDER — HYDROCODONE-ACETAMINOPHEN 5-325 MG PO TABS: 1.0000 | ORAL_TABLET | Freq: Four times a day (QID) | ORAL | Status: DC | PRN

## 2014-11-27 MED ORDER — LIDOCAINE-EPINEPHRINE (PF) 2 %-1:200000 IJ SOLN
10.0000 mL | Freq: Once | INTRAMUSCULAR | Status: AC
  Administered 2014-11-27: 10 mL via INTRADERMAL
  Filled 2014-11-27: qty 20

## 2014-11-27 NOTE — ED Notes (Signed)
Pt reports today tripped and fell hitting head on the grill. No LOC. Has 3 inch laceration to forehead. No blood thinners. Pt is a x 4

## 2014-11-27 NOTE — Progress Notes (Signed)
   Patient presented to the office today for colposcopic evaluation. Her Pap smear history is as follows:  2015: Pap: Normal Ancillary Testing HPV 16,18/45 Genotyping NEGATIVE for HPV 16 & 18/45 Completed by [id number] on 2013-11-17 HPV High Risk **DETECTED**  2016: Pap: Normal High-risk HPV detected  Patient was counseled and underwent make complete detail colposcopic evaluation to include the external genitalia, perineum, perirectal region with no lesions seen even after application of acetic acid. The speculum was introduced into the vagina is systematic inspection vagina, fornix and cervix did not demonstrate any lesions. Since patient is postmenopausal or transformation so was difficult to evaluate him with endocervical speculum. A vigorous ECC was obtained and was cemented for histological evaluation.  Assessment/plan: Patient with 2 years back to back with normal Pap smear but HPV noted although subtype 16, 18, and 45 were not detected.. Negative colposcopy today. ECC obtained results pending at time of this dictation. If ECC is negative will follow-up with Pap smear 1 year according to the ASCCP guidelines.

## 2014-11-27 NOTE — ED Provider Notes (Signed)
CSN: [id number]     Arrival date & time 11/27/14  1244 History  This chart was scribed for non-physician practitioner, Domenic Moras, PA-C,working with Daleen Bo, MD, by Marlowe Kays, ED Scribe. This patient was seen in room TR01C/TR01C and the patient's care was started at 2:45 PM.  Chief Complaint  Patient presents with  . Laceration   The history is provided by the patient and medical records. No language interpreter was used.    HPI Comments:  Bianca Lynch is a 59 y.o. female who presents to the Emergency Department complaining of a laceration to her forehead secondary to tripping and falling down her back steps PTA. She states she tripped over her own feet and her forehead came into contact with the leg of the grill. Pt was wearing glasses at the time of fall and reports soreness where they came into contact with the grill leg. She reports significant bleeding that has since resolved. She reports applying a towel to the area with pressure to treat the bleeding. Denies modifying factors. Denies LOC, CP, SOB, visual changes, hip pain, knee pain, ankle pain, neck pain, abdominal pain, nausea or vomiting. Denies anticoagulant therapy. Pt reports her last tetanus vaccination was 11/11/13.  Past Medical History  Diagnosis Date  . Menopausal symptom     HOT FLASHES  . Osteopenia   . Depression   . Insomnia   . Elevated cholesterol   . Thyroid disease     Hypothyroid   Past Surgical History  Procedure Laterality Date  . Tubal ligation  11/22/1998    [facility]/DR. GOTTSEGEN   Family History  Problem Relation Age of Onset  . Hypertension Mother   . Heart disease Mother   . Cancer Mother     UTERINE  . Heart disease Father   . Breast cancer Paternal Aunt     age 82   History  Substance Use Topics  . Smoking status: Former Smoker    Quit date: 11/09/1999  . Smokeless tobacco: Never Used  . Alcohol Use: 8.4 oz/week    14 Standard drinks or equivalent per week     Comment: WINE  WITH DINNER    OB History    Gravida Para Term Preterm AB TAB SAB Ectopic Multiple Living   0              Review of Systems  Eyes: Negative for visual disturbance.  Respiratory: Negative for shortness of breath.   Cardiovascular: Negative for chest pain.  Gastrointestinal: Negative for nausea, vomiting and abdominal pain.  Musculoskeletal: Negative for arthralgias and neck pain.  Skin: Positive for wound.  Neurological: Negative for syncope and numbness.    Allergies  Review of patient's allergies indicates no known allergies.  Home Medications   Prior to Admission medications   Medication Sig Start Date End Date Taking? Authorizing Provider  atorvastatin (LIPITOR) 20 MG tablet Take 20 mg by mouth daily.    Historical Provider, MD  cetirizine (ZYRTEC) 10 MG tablet Take 10 mg by mouth daily.    Historical Provider, MD  citalopram (CELEXA) 10 MG tablet Take 10 mg by mouth daily.    Historical Provider, MD  levothyroxine (SYNTHROID, LEVOTHROID) 112 MCG tablet Take 112 mcg by mouth daily.    Historical Provider, MD  Multiple Vitamins-Minerals (CENTRUM SILVER PO) Take by mouth.    Historical Provider, MD  Omega-3 Fatty Acids (FISH OIL PO) Take by mouth.    Historical Provider, MD  terconazole (TERAZOL 3) 0.8 %  vaginal cream USE 1 APPLICATORFUL VAGINALLY AT BEDTIME FOR 3 DAYS Patient not taking: Reported on 11/27/2014 07/12/12   Huel Cote, NP  vitamin D, CHOLECALCIFEROL, 400 UNITS tablet Take 400 Units by mouth daily.    Historical Provider, MD   Triage Vitals: BP 152/72 mmHg  Pulse 63  Temp(Src) 97.9 F (36.6 C) (Oral)  Resp 18  Ht 5\' 1"  (1.549 m)  Wt 175 lb (79.379 kg)  BMI 33.08 kg/m2  SpO2 100% Physical Exam  Constitutional: She is oriented to person, place, and time. She appears well-developed and well-nourished.  HENT:  Head: Normocephalic.  Right Ear: Tympanic membrane normal.  Left Ear: Tympanic membrane normal.  Nose: No nasal septal hematoma.  No malocclusion.  3 cm laceration to mid forehead vertically. No active bleeding. No foreign object noted. No crepitus. Tenderness noted to bridge of nose.  Eyes: EOM are normal.  Neck: Normal range of motion.  Cardiovascular: Normal rate.   Pulmonary/Chest: Effort normal.  Musculoskeletal: Normal range of motion.  Neurological: She is alert and oriented to person, place, and time.  Skin: Skin is warm and dry.  Psychiatric: She has a normal mood and affect. Her behavior is normal.  Nursing note and vitals reviewed.   ED Course  Procedures (including critical care time) DIAGNOSTIC STUDIES: Oxygen Saturation is 100% on RA, normal by my interpretation.   COORDINATION OF CARE: 2:45 PM- Will clean and suture wound. Discussed option of head CT and pt and I felt that it is not necessary at this time. Pt aware there's a chance of missing occult fracture without imaging.  Pt understand signs to return if condition worsen.  Pt verbalizes understanding and agrees to plan.  LACERATION REPAIR PROCEDURE NOTE The patient's identification was confirmed and consent was obtained. This procedure was performed by Domenic Moras, PA-C at 3:00 PM. Site: mid forehead Sterile procedures observed Anesthetic used (type and amt): Lidocaine 2% with Epinephrine (2 mLs) Suture type/size: 5-0 Prolene Length: 3 cm # of Sutures: 7 Technique: simple, interrupted Complexity: simple Antibx ointment applied Tetanus UTD Site anesthetized, irrigated with NS, explored without evidence of foreign body, wound well approximated, site covered with dry, sterile dressing.  Patient tolerated procedure well without complications. Instructions for care discussed verbally and patient provided with additional written instructions for homecare and f/u.  Medications  lidocaine-EPINEPHrine (XYLOCAINE W/EPI) 2 %-1:200000 (PF) injection 10 mL (10 mLs Intradermal Given by Other 11/27/14 1454)   Labs Review Labs Reviewed - No data to display  Imaging  Review No results found.   EKG Interpretation None      MDM   Final diagnoses:  Forehead laceration, initial encounter    BP 164/97 mmHg  Pulse 58  Temp(Src) 97.4 F (36.3 C) (Oral)  Resp 18  Ht 5\' 1"  (1.549 m)  Wt 175 lb (79.379 kg)  BMI 33.08 kg/m2  SpO2 100%   I personally performed the services described in this documentation, which was scribed in my presence. The recorded information has been reviewed and is accurate.    Domenic Moras, PA-C 11/27/14 1533  Daleen Bo, MD 11/27/14 [phone]

## 2014-11-27 NOTE — Discharge Instructions (Signed)
Please follow up with your doctor or urgent care in 5 days to have your sutures removed.    Facial Laceration  A facial laceration is a cut on the face. These injuries can be painful and cause bleeding. Lacerations usually heal quickly, but they need special care to reduce scarring. DIAGNOSIS  Your health care provider will take a medical history, ask for details about how the injury occurred, and examine the wound to determine how deep the cut is. TREATMENT  Some facial lacerations may not require closure. Others may not be able to be closed because of an increased risk of infection. The risk of infection and the chance for successful closure will depend on various factors, including the amount of time since the injury occurred. The wound may be cleaned to help prevent infection. If closure is appropriate, pain medicines may be given if needed. Your health care provider will use stitches (sutures), wound glue (adhesive), or skin adhesive strips to repair the laceration. These tools bring the skin edges together to allow for faster healing and a better cosmetic outcome. If needed, you may also be given a tetanus shot. HOME CARE INSTRUCTIONS  Only take over-the-counter or prescription medicines as directed by your health care provider.  Follow your health care provider's instructions for wound care. These instructions will vary depending on the technique used for closing the wound. For Sutures:  Keep the wound clean and dry.   If you were given a bandage (dressing), you should change it at least once a day. Also change the dressing if it becomes wet or dirty, or as directed by your health care provider.   Wash the wound with soap and water 2 times a day. Rinse the wound off with water to remove all soap. Pat the wound dry with a clean towel.   After cleaning, apply a thin layer of the antibiotic ointment recommended by your health care provider. This will help prevent infection and keep the  dressing from sticking.   You may shower as usual after the first 24 hours. Do not soak the wound in water until the sutures are removed.   Get your sutures removed as directed by your health care provider. With facial lacerations, sutures should usually be taken out after 4-5 days to avoid stitch marks.   Wait a few days after your sutures are removed before applying any makeup. For Skin Adhesive Strips:  Keep the wound clean and dry.   Do not get the skin adhesive strips wet. You may bathe carefully, using caution to keep the wound dry.   If the wound gets wet, pat it dry with a clean towel.   Skin adhesive strips will fall off on their own. You may trim the strips as the wound heals. Do not remove skin adhesive strips that are still stuck to the wound. They will fall off in time.  For Wound Adhesive:  You may briefly wet your wound in the shower or bath. Do not soak or scrub the wound. Do not swim. Avoid periods of heavy sweating until the skin adhesive has fallen off on its own. After showering or bathing, gently pat the wound dry with a clean towel.   Do not apply liquid medicine, cream medicine, ointment medicine, or makeup to your wound while the skin adhesive is in place. This may loosen the film before your wound is healed.   If a dressing is placed over the wound, be careful not to apply tape directly over  the skin adhesive. This may cause the adhesive to be pulled off before the wound is healed.   Avoid prolonged exposure to sunlight or tanning lamps while the skin adhesive is in place.  The skin adhesive will usually remain in place for 5-10 days, then naturally fall off the skin. Do not pick at the adhesive film.  After Healing: Once the wound has healed, cover the wound with sunscreen during the day for 1 full year. This can help minimize scarring. Exposure to ultraviolet light in the first year will darken the scar. It can take 1-2 years for the scar to lose its  redness and to heal completely.  SEEK IMMEDIATE MEDICAL CARE IF:  You have redness, pain, or swelling around the wound.   You see ayellowish-white fluid (pus) coming from the wound.   You have chills or a fever.  MAKE SURE YOU:  Understand these instructions.  Will watch your condition.  Will get help right away if you are not doing well or get worse. Document Released: 08/03/2004 Document Revised: 04/16/2013 Document Reviewed: 02/06/2013 [facility] Patient Information 2015 [place], [place]. This information is not intended to replace advice given to you by your health care provider. Make sure you discuss any questions you have with your health care provider.

## 2014-12-02 ENCOUNTER — Telehealth: Payer: Self-pay | Admitting: *Deleted

## 2014-12-02 ENCOUNTER — Other Ambulatory Visit: Payer: Managed Care, Other (non HMO) | Admitting: Anesthesiology

## 2014-12-02 DIAGNOSIS — Z1211 Encounter for screening for malignant neoplasm of colon: Secondary | ICD-10-CM | POA: Diagnosis not present

## 2014-12-02 NOTE — Telephone Encounter (Signed)
Pt called and left message in traige to schedule OV to have snitches removed here. Pt had forehead laceration was seen at ER on 11/27/14. I called patient back and left on her voicemail to make OV with PCP to be removed.

## 2014-12-23 ENCOUNTER — Encounter: Payer: Self-pay | Admitting: Gastroenterology

## 2015-02-09 ENCOUNTER — Ambulatory Visit (AMBULATORY_SURGERY_CENTER): Payer: Self-pay | Admitting: *Deleted

## 2015-02-09 VITALS — Ht 60.5 in | Wt 179.0 lb

## 2015-02-09 DIAGNOSIS — Z1211 Encounter for screening for malignant neoplasm of colon: Secondary | ICD-10-CM

## 2015-02-09 MED ORDER — SUPREP BOWEL PREP KIT 17.5-3.13-1.6 GM/177ML PO SOLN: 1.0000 | Freq: Once | ORAL | Status: DC

## 2015-02-09 NOTE — Progress Notes (Signed)
No Eggs or soy products.  No anesthesia problems.  No diet medication.  No Oxygen.

## 2015-02-17 ENCOUNTER — Telehealth: Payer: Self-pay | Admitting: *Deleted

## 2015-02-17 NOTE — Telephone Encounter (Signed)
Pt called for a recommendation for PCP. Pt [place] close to [place]. Gave her the name of [facility] with names of providers there. KW CMA

## 2015-02-19 NOTE — Addendum Note (Signed)
Addended by: [place] Bailiff D on: 02/19/2015 03:22 PM   Modules accepted: Orders

## 2015-02-23 ENCOUNTER — Ambulatory Visit (AMBULATORY_SURGERY_CENTER): Payer: 59 | Admitting: Gastroenterology

## 2015-02-23 ENCOUNTER — Encounter: Payer: Self-pay | Admitting: Gastroenterology

## 2015-02-23 VITALS — BP 112/68 | HR 50 | Temp 97.7°F | Resp 14 | Ht 60.0 in | Wt 179.0 lb

## 2015-02-23 DIAGNOSIS — Z1211 Encounter for screening for malignant neoplasm of colon: Secondary | ICD-10-CM

## 2015-02-23 DIAGNOSIS — D125 Benign neoplasm of sigmoid colon: Secondary | ICD-10-CM

## 2015-02-23 MED ORDER — SODIUM CHLORIDE 0.9 % IV SOLN: 500.0000 mL | INTRAVENOUS | Status: DC

## 2015-02-23 NOTE — Patient Instructions (Signed)
YOU HAD AN ENDOSCOPIC PROCEDURE TODAY AT [facility]:   Refer to the procedure report that was given to you for any specific questions about what was found during the examination.  If the procedure report does not answer your questions, please call your gastroenterologist to clarify.  If you requested that your care partner not be given the details of your procedure findings, then the procedure report has been included in a sealed envelope for you to review at your convenience later.  YOU SHOULD EXPECT: Some feelings of bloating in the abdomen. Passage of more gas than usual.  Walking can help get rid of the air that was put into your GI tract during the procedure and reduce the bloating. If you had a lower endoscopy (such as a colonoscopy or flexible sigmoidoscopy) you may notice spotting of blood in your stool or on the toilet paper. If you underwent a bowel prep for your procedure, you may not have a normal bowel movement for a few days.  Please Note:  You might notice some irritation and congestion in your nose or some drainage.  This is from the oxygen used during your procedure.  There is no need for concern and it should clear up in a day or so.  SYMPTOMS TO REPORT IMMEDIATELY:   Following lower endoscopy (colonoscopy or flexible sigmoidoscopy):  Excessive amounts of blood in the stool  Significant tenderness or worsening of abdominal pains  Swelling of the abdomen that is new, acute  Fever of 100F or higher   For urgent or emergent issues, a gastroenterologist can be reached at any hour by calling [phone].   DIET: Your first meal following the procedure should be a small meal and then it is ok to progress to your normal diet. Heavy or fried foods are harder to digest and may make you feel nauseous or bloated.  Likewise, meals heavy in dairy and vegetables can increase bloating.  Drink plenty of fluids but you should avoid alcoholic beverages for 24  hours.  ACTIVITY:  You should plan to take it easy for the rest of today and you should NOT DRIVE or use heavy machinery until tomorrow (because of the sedation medicines used during the test).    FOLLOW UP: Our staff will call the number listed on your records the next business day following your procedure to check on you and address any questions or concerns that you may have regarding the information given to you following your procedure. If we do not reach you, we will leave a message.  However, if you are feeling well and you are not experiencing any problems, there is no need to return our call.  We will assume that you have returned to your regular daily activities without incident.  If any biopsies were taken you will be contacted by phone or by letter within the next 1-3 weeks.  Please call us at [phone] if you have not heard about the biopsies in 3 weeks.    SIGNATURES/CONFIDENTIALITY: You and/or your care partner have signed paperwork which will be entered into your electronic medical record.  These signatures attest to the fact that that the information above on your After Visit Summary has been reviewed and is understood.  Full responsibility of the confidentiality of this discharge information lies with you and/or your care -partner.  Polyps/ Hemorrhoid handout given Await pathology report

## 2015-02-23 NOTE — Op Note (Addendum)
[place]  [address] [place], [address]   COLONOSCOPY PROCEDURE REPORT  PATIENT: Bianca Lynch, Bianca Lynch  MR#: [id number] BIRTHDATE: 05-31-1956 , 1  yrs. old GENDER: female ENDOSCOPIST: Ladene Artist, MD, [facility] REFERRED BY:  Alma Friendly, NP  Ri PROCEDURE DATE:  02/23/2015 PROCEDURE:   Colonoscopy, screening and Colonoscopy with snare polypectomy First Screening Colonoscopy - Avg.  risk and is 50 yrs.  old or older Yes.  Prior Negative Screening - Now for repeat screening. N/A  History of Adenoma - Now for follow-up colonoscopy & has been > or = to 3 yrs.  N/A  Polyps removed today? Yes ASA CLASS:   Class II INDICATIONS:Screening for colonic neoplasia and Colorectal Neoplasm Risk Assessment for this procedure is average risk. MEDICATIONS: Monitored anesthesia care, Propofol 230 mg IV, and Lidocaine 150 mg IV DESCRIPTION OF PROCEDURE:   After the risks benefits and alternatives of the procedure were thoroughly explained, informed consent was obtained.  The digital rectal exam revealed external hemorrhoids.   The [id number]  endoscope was introduced through the anus and advanced to the cecum, which was identified by both the appendix and ileocecal valve. No adverse events experienced.   The quality of the prep was excellent.  (Suprep was used)  The instrument was then slowly withdrawn as the colon was fully examined. Estimated blood loss is zero unless otherwise noted in this procedure report.    COLON FINDINGS: Three sessile polyps measuring 5 mm in size were found in the sigmoid colon.  Polypectomies were performed with a cold snare.  The resection was complete, the polyp tissue was completely retrieved and sent to histology.   The examination was otherwise normal.  Retroflexed views revealed no abnormalities. The time to cecum = 1.9 Withdrawal time = 11.4   The scope was withdrawn and the procedure completed. COMPLICATIONS: There were no  immediate complications.  ENDOSCOPIC IMPRESSION: 1.   Three sessile polyps in the sigmoid colon; polypectomies performed with a cold snare 2.   Small external hemorrhoids  RECOMMENDATIONS: 1.  Await pathology results 2.  Repeat colonoscopy in 5 years if polyp(s) adenomatous; otherwise 10 years  eSigned:  Ladene Artist, MD, [facility] 02/23/2015 8:37 AM Revised: 02/23/2015 8:37 AM

## 2015-02-23 NOTE — Progress Notes (Signed)
Report to PACU, RN, vss, BBS= Clear.  

## 2015-02-23 NOTE — Progress Notes (Signed)
Called to room to assist during endoscopic procedure.  Patient ID and intended procedure confirmed with present staff. Received instructions for my participation in the procedure from the performing physician.  

## 2015-02-24 ENCOUNTER — Telehealth: Payer: Self-pay

## 2015-02-24 ENCOUNTER — Encounter: Payer: Self-pay | Admitting: Primary Care

## 2015-02-24 ENCOUNTER — Ambulatory Visit (INDEPENDENT_AMBULATORY_CARE_PROVIDER_SITE_OTHER): Payer: 59 | Admitting: Primary Care

## 2015-02-24 VITALS — BP 122/82 | HR 62 | Temp 98.2°F | Ht 61.0 in | Wt 178.0 lb

## 2015-02-24 DIAGNOSIS — E7800 Pure hypercholesterolemia, unspecified: Secondary | ICD-10-CM

## 2015-02-24 DIAGNOSIS — F32A Depression, unspecified: Secondary | ICD-10-CM

## 2015-02-24 DIAGNOSIS — F329 Major depressive disorder, single episode, unspecified: Secondary | ICD-10-CM

## 2015-02-24 DIAGNOSIS — E079 Disorder of thyroid, unspecified: Secondary | ICD-10-CM | POA: Diagnosis not present

## 2015-02-24 NOTE — Patient Instructions (Signed)
Work to decrease portion sizes. Decrease alcohol to 1 glass every other night. Limit carbohydrates in the form of bread, pasta, rice, potatoes.  Start exercising. Your goal is to exercise 1 hour daily at least 5 days per week.   Your goal is to lose 5-10 pounds by next visit in March.  You should be getting about 1200 calories daily for weight loss.  Please schedule a physical with me in March 2017. You will also schedule a lab only appointment one week prior. We will discuss your lab results during your physical.  It was a pleasure to meet you today! Please don't hesitate to call me with any questions. Welcome to [facility]!

## 2015-02-24 NOTE — Progress Notes (Signed)
Pre visit review using our clinic review tool, if applicable. No additional management support is needed unless otherwise documented below in the visit note. 

## 2015-02-24 NOTE — Telephone Encounter (Signed)
  Follow up Call-  Call back number 02/23/2015  Post procedure Call Back phone  # [phone]  Permission to leave phone message Yes     Patient questions:  Do you have a fever, pain , or abdominal swelling? No. Pain Score  0 *  Have you tolerated food without any problems? Yes.    Have you been able to return to your normal activities? Yes.    Do you have any questions about your discharge instructions: Diet   No. Medications  No. Follow up visit  No.  Do you have questions or concerns about your Care? No.  Actions: * If pain score is 4 or above: No action needed, pain <4.

## 2015-02-24 NOTE — Progress Notes (Addendum)
Subjective:    Patient ID: Bianca Lynch, female    DOB: 1955/12/14, 59 y.o.   MRN: [id number]  HPI  Bianca Lynch is a 59 year old female who presents today to establish care and discuss the problems mentioned below. Will obtain old records. Last physical was in March 2016.  1) Hypothyroidism: Diagnosed in 2003 with hyperthyroidism, had the radioactive iodine and now with hypothyroidism. She is managed on levothyroxine 112 mcg and has been on for years without adjustment. Last TSH was 6 months ago and stable per patient.  2) Depression: Diagnosed with depression since 1990's. She is currently managed on citalopram 10 mg tablets and feels well managed. Denies SI/HI  3) Hyperlipidemia: Diagnosed 5 years ago. She is managed on Lipitor 20 mg. Last lipid panel was 6 months ago and was normal per patient.  Breakfast: Carnation instant breakfast with skim milk Lunch: Left overs from dinner Dinner: Grilled chicken, vegetables, pork chops, burger (occasionally), [place], pasta, rice. Limited bread. Desserts: Rarely Beverages: Coffee, water, wine (3 full glass of red wine per night). Exercise: Does not exercise.  Review of Systems  Constitutional: Negative for unexpected weight change.  HENT: Negative for rhinorrhea.   Respiratory: Negative for cough and shortness of breath.   Cardiovascular: Negative for chest pain.  Gastrointestinal: Negative for diarrhea and constipation.  Endocrine: Negative for cold intolerance.  Genitourinary: Negative for difficulty urinating.  Musculoskeletal: Negative for myalgias and arthralgias.  Skin: Negative for rash.       History of eczema  Neurological: Negative for dizziness, numbness and headaches.  Psychiatric/Behavioral:       See HPI       Past Medical History  Diagnosis Date  . Menopausal symptom     HOT FLASHES  . Osteopenia   . Depression   . Insomnia     patient denies this at PAT appt 02/09/15  . Elevated cholesterol   . Thyroid disease     Hypothyroid  . Allergy     seasonal  . GERD (gastroesophageal reflux disease)   . Chicken pox   . Meniere disease     Social History   Social History  . Marital Status: Married    Spouse Name: N/A  . Number of Children: N/A  . Years of Education: N/A   Occupational History  . Not on file.   Social History Main Topics  . Smoking status: Former Smoker    Quit date: 11/09/1999  . Smokeless tobacco: Never Used  . Alcohol Use: 8.4 oz/week    14 Standard drinks or equivalent per week     Comment: [place]   . Drug Use: No  . Sexual Activity: No   Other Topics Concern  . Not on file   Social History Narrative   Married.   No children.   Works as an Optometrist.   Enjoys bowling, play computer games, crossword puzzles       Past Surgical History  Procedure Laterality Date  . Tubal ligation  11/22/1998    [facility]  . Colonoscopy    . Wisdom tooth extraction      Family History  Problem Relation Age of Onset  . Hypertension Mother   . Heart disease Mother   . Cancer Mother     UTERINE  . Heart disease Father   . Breast cancer Paternal Aunt     age 36  . Colon cancer Neg Hx     No Known Allergies  Current Outpatient Prescriptions on File Prior to Visit  Medication Sig Dispense Refill  . atorvastatin (LIPITOR) 20 MG tablet Take 20 mg by mouth daily.    . cetirizine (ZYRTEC) 10 MG tablet Take 10 mg by mouth daily.    . citalopram (CELEXA) 10 MG tablet Take 10 mg by mouth daily.    Marland Kitchen esomeprazole (NEXIUM) 20 MG capsule Take 20 mg by mouth daily at 12 noon.    Marland Kitchen levothyroxine (SYNTHROID, LEVOTHROID) 112 MCG tablet Take 112 mcg by mouth daily.    . Multiple Vitamins-Minerals (CENTRUM SILVER PO) Take by mouth.    . terconazole (TERAZOL 3) 0.8 % vaginal cream USE 1 APPLICATORFUL VAGINALLY AT BEDTIME FOR 3 DAYS (Patient not taking: Reported on 11/27/2014) 20 g 2   No current facility-administered medications on file prior to visit.    BP  122/82 mmHg  Pulse 62  Temp(Src) 98.2 F (36.8 C) (Oral)  Ht 5\' 1"  (1.549 m)  Wt 178 lb (80.74 kg)  BMI 33.65 kg/m2  SpO2 98%    Objective:   Physical Exam  Constitutional: She appears well-nourished.  Cardiovascular: Normal rate and regular rhythm.   Pulmonary/Chest: Effort normal and breath sounds normal.  Skin: Skin is warm and dry.  Psychiatric: She has a normal mood and affect.          Assessment & Plan:

## 2015-02-24 NOTE — Assessment & Plan Note (Signed)
Initially hyperthyroid, underwent radioactive iodine, now hypothyroid. Managed on levothyroxine 112 mcg and has been stable on this dose for years. Last TSH was march 2016. Will repeat March 2017. Asymptomatic.

## 2015-02-24 NOTE — Assessment & Plan Note (Signed)
Managed on lipitor 20 mg. Last lipid panel in March 2016. Will recheck in march 2017. Discussed importance of healthy diet and exercise for weight loss.

## 2015-02-24 NOTE — Assessment & Plan Note (Signed)
Stable. Managed on citalopram 10 mg. Feels well managed. Continue same, follow up during March physical.

## 2015-03-01 ENCOUNTER — Encounter: Payer: Self-pay | Admitting: Gastroenterology

## 2015-03-30 ENCOUNTER — Other Ambulatory Visit: Payer: Self-pay

## 2015-03-30 MED ORDER — ATORVASTATIN CALCIUM 20 MG PO TABS: 20.0000 mg | ORAL_TABLET | Freq: Every day | ORAL | Status: DC

## 2015-03-30 NOTE — Telephone Encounter (Signed)
Pt request refill atorvastatin to [facility]. Pt established care on 02/24/15 and per note to continue med until CPX 09/2015. Advised pt refill done.

## 2015-06-08 ENCOUNTER — Other Ambulatory Visit: Payer: Self-pay | Admitting: Primary Care

## 2015-06-08 ENCOUNTER — Other Ambulatory Visit: Payer: Self-pay

## 2015-06-08 DIAGNOSIS — F32A Depression, unspecified: Secondary | ICD-10-CM

## 2015-06-08 DIAGNOSIS — F329 Major depressive disorder, single episode, unspecified: Secondary | ICD-10-CM

## 2015-06-08 MED ORDER — CITALOPRAM HYDROBROMIDE 20 MG PO TABS: 20.0000 mg | ORAL_TABLET | Freq: Every day | ORAL | Status: DC

## 2015-06-08 MED ORDER — LEVOTHYROXINE SODIUM 112 MCG PO TABS: 112.0000 ug | ORAL_TABLET | Freq: Every day | ORAL | Status: DC

## 2015-06-08 NOTE — Telephone Encounter (Signed)
Pt request refill citalopram 10 mg and levothyroxine to [facility]; CPX scheduled 10/05/15.pt said she takes citalopram 20 mg one daily.  On med list has citalopram 10 mg daily.Please advise. Pt request cb when refilled.

## 2015-06-09 NOTE — Telephone Encounter (Signed)
Called and notified patient of Rx have been sent to [facility]. Patient verbalized understanding.

## 2015-07-07 ENCOUNTER — Encounter: Payer: Self-pay | Admitting: Primary Care

## 2015-07-07 ENCOUNTER — Ambulatory Visit (INDEPENDENT_AMBULATORY_CARE_PROVIDER_SITE_OTHER): Payer: 59 | Admitting: Primary Care

## 2015-07-07 VITALS — BP 132/82 | HR 66 | Temp 98.2°F | Wt 178.2 lb

## 2015-07-07 DIAGNOSIS — R059 Cough, unspecified: Secondary | ICD-10-CM

## 2015-07-07 DIAGNOSIS — R05 Cough: Secondary | ICD-10-CM

## 2015-07-07 MED ORDER — BENZONATATE 200 MG PO CAPS: 200.0000 mg | ORAL_CAPSULE | Freq: Three times a day (TID) | ORAL | Status: DC | PRN

## 2015-07-07 NOTE — Progress Notes (Signed)
Subjective:    Patient ID: Bianca Lynch, female    DOB: 1956-03-21, 59 y.o.   MRN: [id number]  HPI  [name] is a 59 year old female who presents today with a chief complaint of cough. Her cough has been present for the past 3 weeks and is gradually improving. Her cough is no longer productive. Denies fevers, feeling fatigued, sore throat. Overall she's feeling well. She's not taking anything over the counter for her symptoms. Her family encouraged her to be seen today.  Review of Systems  Constitutional: Negative for fever, chills and fatigue.  HENT: Positive for congestion. Negative for ear pain, rhinorrhea, sinus pressure and sore throat.   Respiratory: Positive for cough. Negative for shortness of breath.   Cardiovascular: Negative for chest pain.       Past Medical History  Diagnosis Date  . Menopausal symptom     HOT FLASHES  . Osteopenia   . Depression   . Insomnia     patient denies this at PAT appt 02/09/15  . Elevated cholesterol   . Thyroid disease     Hypothyroid  . Allergy     seasonal  . GERD (gastroesophageal reflux disease)   . Chicken pox   . Meniere disease   . Eczema     Social History   Social History  . Marital Status: Married    Spouse Name: N/A  . Number of Children: N/A  . Years of Education: N/A   Occupational History  . Not on file.   Social History Main Topics  . Smoking status: Former Smoker    Quit date: 11/09/1999  . Smokeless tobacco: Never Used  . Alcohol Use: 8.4 oz/week    14 Standard drinks or equivalent per week     Comment: [place]   . Drug Use: No  . Sexual Activity: No   Other Topics Concern  . Not on file   Social History Narrative   Married.   No children.   Works as an Optometrist.   Enjoys bowling, play computer games, crossword puzzles       Past Surgical History  Procedure Laterality Date  . Tubal ligation  11/22/1998    [facility]  . Colonoscopy    . Wisdom tooth extraction       Family History  Problem Relation Age of Onset  . Hypertension Mother   . Heart disease Mother   . Cancer Mother     UTERINE  . Heart disease Father   . Breast cancer Paternal Aunt     age 28  . Colon cancer Neg Hx     No Known Allergies  Current Outpatient Prescriptions on File Prior to Visit  Medication Sig Dispense Refill  . atorvastatin (LIPITOR) 20 MG tablet Take 1 tablet (20 mg total) by mouth daily. 90 tablet 1  . cetirizine (ZYRTEC) 10 MG tablet Take 10 mg by mouth daily.    . citalopram (CELEXA) 20 MG tablet Take 1 tablet (20 mg total) by mouth daily. 90 tablet 1  . esomeprazole (NEXIUM) 20 MG capsule Take 20 mg by mouth daily at 12 noon.    Marland Kitchen levothyroxine (SYNTHROID, LEVOTHROID) 112 MCG tablet Take 1 tablet (112 mcg total) by mouth daily. 90 tablet 1  . Multiple Vitamins-Minerals (CENTRUM SILVER PO) Take by mouth.     No current facility-administered medications on file prior to visit.    BP 132/82 mmHg  Pulse 66  Temp(Src) 98.2 F (  36.8 C) (Oral)  Wt 178 lb 4 oz (80.854 kg)  SpO2 98%    Objective:   Physical Exam  Constitutional: She appears well-nourished.  HENT:  Right Ear: Tympanic membrane and ear canal normal.  Left Ear: Tympanic membrane and ear canal normal.  Nose: Right sinus exhibits no maxillary sinus tenderness and no frontal sinus tenderness. Left sinus exhibits no maxillary sinus tenderness and no frontal sinus tenderness.  Mouth/Throat: Posterior oropharyngeal erythema present. No oropharyngeal exudate or posterior oropharyngeal edema.  Cardiovascular: Normal rate and regular rhythm.   Pulmonary/Chest: Effort normal and breath sounds normal. She has no wheezes. She has no rales.  Skin: Skin is warm and dry.          Assessment & Plan:  Acute Bronchitis:  Cough x 3 weeks, gradually improving. Feels well overall. She's not taken anything OTC. Feels as though it's working its way out. Exam unremarkable. Lungs clear, mild erythema to  throat. Reassurance provided that this was likely caused by a viral infection and that it should continue to improve. Treat with supportive measures: Mucinex DM and tessalon pearls PRN. Fluids, rest. Return precautions provided.

## 2015-07-07 NOTE — Progress Notes (Signed)
Pre visit review using our clinic review tool, if applicable. No additional management support is needed unless otherwise documented below in the visit note. 

## 2015-07-07 NOTE — Patient Instructions (Addendum)
Your symptoms are related to a viral illness which will pass on its own.  I would like for you to try taking Mucinex DM for cough and congestion. Take this with a full glass of water.  I've sent in a prescription for Benzonatate capsules for you to use as needed for cough. Take 1 capsule by mouth three times daily as needed.   Ensure you are staying hydrated with fluids.  Please call me if you develop fevers, your cough becomes worse, or you start to feel worse.  It was a pleasure to see you today!  Acute Bronchitis Bronchitis is inflammation of the airways that extend from the windpipe into the lungs (bronchi). The inflammation often causes mucus to develop. This leads to a cough, which is the most common symptom of bronchitis.  In acute bronchitis, the condition usually develops suddenly and goes away over time, usually in a couple weeks. Smoking, allergies, and asthma can make bronchitis worse. Repeated episodes of bronchitis may cause further lung problems.  CAUSES Acute bronchitis is most often caused by the same virus that causes a cold. The virus can spread from person to person (contagious) through coughing, sneezing, and touching contaminated objects. SIGNS AND SYMPTOMS   Cough.   Fever.   Coughing up mucus.   Body aches.   Chest congestion.   Chills.   Shortness of breath.   Sore throat.  DIAGNOSIS  Acute bronchitis is usually diagnosed through a physical exam. Your health care provider will also ask you questions about your medical history. Tests, such as chest X-rays, are sometimes done to rule out other conditions.  TREATMENT  Acute bronchitis usually goes away in a couple weeks. Oftentimes, no medical treatment is necessary. Medicines are sometimes given for relief of fever or cough. Antibiotic medicines are usually not needed but may be prescribed in certain situations. In some cases, an inhaler may be recommended to help reduce shortness of breath and  control the cough. A cool mist vaporizer may also be used to help thin bronchial secretions and make it easier to clear the chest.  HOME CARE INSTRUCTIONS  Get plenty of rest.   Drink enough fluids to keep your urine clear or pale yellow (unless you have a medical condition that requires fluid restriction). Increasing fluids may help thin your respiratory secretions (sputum) and reduce chest congestion, and it will prevent dehydration.   Take medicines only as directed by your health care provider.  If you were prescribed an antibiotic medicine, finish it all even if you start to feel better.  Avoid smoking and secondhand smoke. Exposure to cigarette smoke or irritating chemicals will make bronchitis worse. If you are a smoker, consider using nicotine gum or skin patches to help control withdrawal symptoms. Quitting smoking will help your lungs heal faster.   Reduce the chances of another bout of acute bronchitis by washing your hands frequently, avoiding people with cold symptoms, and trying not to touch your hands to your mouth, nose, or eyes.   Keep all follow-up visits as directed by your health care provider.  SEEK MEDICAL CARE IF: Your symptoms do not improve after 1 week of treatment.  SEEK IMMEDIATE MEDICAL CARE IF:  You develop an increased fever or chills.   You have chest pain.   You have severe shortness of breath.  You have bloody sputum.   You develop dehydration.  You faint or repeatedly feel like you are going to pass out.  You develop repeated vomiting.  You develop a severe headache. MAKE SURE YOU:   Understand these instructions.  Will watch your condition.  Will get help right away if you are not doing well or get worse.   This information is not intended to replace advice given to you by your health care provider. Make sure you discuss any questions you have with your health care provider.   Document Released: 08/03/2004 Document Revised:  07/17/2014 Document Reviewed: 12/17/2012 Elsevier Interactive Patient Education [employer].

## 2015-08-24 ENCOUNTER — Other Ambulatory Visit: Payer: Self-pay | Admitting: Primary Care

## 2015-08-24 DIAGNOSIS — E785 Hyperlipidemia, unspecified: Secondary | ICD-10-CM

## 2015-08-24 NOTE — Telephone Encounter (Signed)
Electronically refill request for   atorvastatin (LIPITOR) 20 MG tablet   Take 1 tablet (20 mg total) by mouth daily.  Dispense: 90 tablet   Refills: 1     Last prescribed on 03/30/2015. Last seen on 07/07/2015. CPE on 10/05/2015.

## 2015-09-21 ENCOUNTER — Other Ambulatory Visit: Payer: Self-pay | Admitting: Primary Care

## 2015-09-21 DIAGNOSIS — E785 Hyperlipidemia, unspecified: Secondary | ICD-10-CM

## 2015-09-21 DIAGNOSIS — E039 Hypothyroidism, unspecified: Secondary | ICD-10-CM

## 2015-09-21 DIAGNOSIS — Z0000 Encounter for general adult medical examination without abnormal findings: Secondary | ICD-10-CM

## 2015-09-28 ENCOUNTER — Other Ambulatory Visit (INDEPENDENT_AMBULATORY_CARE_PROVIDER_SITE_OTHER): Payer: 59

## 2015-09-28 DIAGNOSIS — E039 Hypothyroidism, unspecified: Secondary | ICD-10-CM

## 2015-09-28 DIAGNOSIS — E785 Hyperlipidemia, unspecified: Secondary | ICD-10-CM

## 2015-09-28 DIAGNOSIS — Z0000 Encounter for general adult medical examination without abnormal findings: Secondary | ICD-10-CM | POA: Diagnosis not present

## 2015-09-28 LAB — COMPREHENSIVE METABOLIC PANEL
ALBUMIN: 4.5 g/dL (ref 3.5–5.2)
ALK PHOS: 55 U/L (ref 39–117)
ALT: 17 U/L (ref 0–35)
AST: 19 U/L (ref 0–37)
BILIRUBIN TOTAL: 0.7 mg/dL (ref 0.2–1.2)
BUN: 13 mg/dL (ref 6–23)
CALCIUM: 9.7 mg/dL (ref 8.4–10.5)
CHLORIDE: 102 meq/L (ref 96–112)
CO2: 32 mEq/L (ref 19–32)
CREATININE: 0.86 mg/dL (ref 0.40–1.20)
GFR: 71.67 mL/min (ref >60.00)
Glucose, Bld: 89 mg/dL (ref 70–99)
Potassium: 4.4 mEq/L (ref 3.5–5.1)
Sodium: 140 mEq/L (ref 135–145)
TOTAL PROTEIN: 7.6 g/dL (ref 6.0–8.3)

## 2015-09-28 LAB — TSH: TSH: 1.25 u[IU]/mL (ref 0.35–4.50)

## 2015-09-28 LAB — LIPID PANEL
CHOLESTEROL: 212 mg/dL — AB (ref 0–200)
HDL: 53.9 mg/dL (ref >39.00)
NONHDL: 158.26
Total CHOL/HDL Ratio: 4
Triglycerides: 225 mg/dL — ABNORMAL HIGH (ref 0.0–149.0)
VLDL: 45 mg/dL — ABNORMAL HIGH (ref 0.0–40.0)

## 2015-09-28 LAB — CBC
HCT: 40.6 % (ref 36.0–46.0)
Hemoglobin: 13.8 g/dL (ref 12.0–15.0)
MCHC: 33.9 g/dL (ref 30.0–36.0)
MCV: 94.2 fl (ref 78.0–100.0)
PLATELETS: 286 10*3/uL (ref 150.0–400.0)
RBC: 4.31 Mil/uL (ref 3.87–5.11)
RDW: 13.8 % (ref 11.5–15.5)
WBC: 8 10*3/uL (ref 4.0–10.5)

## 2015-09-28 LAB — LDL CHOLESTEROL, DIRECT: Direct LDL: 108 mg/dL

## 2015-09-28 LAB — HEMOGLOBIN A1C: HEMOGLOBIN A1C: 5.7 % (ref 4.6–6.5)

## 2015-09-28 LAB — VITAMIN D 25 HYDROXY (VIT D DEFICIENCY, FRACTURES): VITD: 34.36 ng/mL (ref 30.00–100.00)

## 2015-10-05 ENCOUNTER — Encounter: Payer: 59 | Admitting: Primary Care

## 2015-10-06 ENCOUNTER — Other Ambulatory Visit: Payer: Self-pay

## 2015-10-06 DIAGNOSIS — Z1231 Encounter for screening mammogram for malignant neoplasm of breast: Secondary | ICD-10-CM

## 2015-10-08 ENCOUNTER — Encounter: Payer: Self-pay | Admitting: Primary Care

## 2015-10-08 ENCOUNTER — Ambulatory Visit (INDEPENDENT_AMBULATORY_CARE_PROVIDER_SITE_OTHER): Payer: 59 | Admitting: Primary Care

## 2015-10-08 VITALS — BP 124/70 | HR 61 | Temp 98.0°F | Ht 61.0 in | Wt 181.0 lb

## 2015-10-08 DIAGNOSIS — E079 Disorder of thyroid, unspecified: Secondary | ICD-10-CM | POA: Diagnosis not present

## 2015-10-08 DIAGNOSIS — E039 Hypothyroidism, unspecified: Secondary | ICD-10-CM

## 2015-10-08 DIAGNOSIS — F32A Depression, unspecified: Secondary | ICD-10-CM

## 2015-10-08 DIAGNOSIS — F329 Major depressive disorder, single episode, unspecified: Secondary | ICD-10-CM

## 2015-10-08 DIAGNOSIS — E785 Hyperlipidemia, unspecified: Secondary | ICD-10-CM

## 2015-10-08 DIAGNOSIS — Z0000 Encounter for general adult medical examination without abnormal findings: Secondary | ICD-10-CM

## 2015-10-08 DIAGNOSIS — M8580 Other specified disorders of bone density and structure, unspecified site: Secondary | ICD-10-CM

## 2015-10-08 DIAGNOSIS — E7800 Pure hypercholesterolemia, unspecified: Secondary | ICD-10-CM

## 2015-10-08 MED ORDER — LEVOTHYROXINE SODIUM 112 MCG PO TABS: 112.0000 ug | ORAL_TABLET | Freq: Every day | ORAL | Status: DC

## 2015-10-08 MED ORDER — CITALOPRAM HYDROBROMIDE 20 MG PO TABS: 20.0000 mg | ORAL_TABLET | Freq: Every day | ORAL | Status: DC

## 2015-10-08 MED ORDER — ATORVASTATIN CALCIUM 40 MG PO TABS
40.0000 mg | ORAL_TABLET | Freq: Every day | ORAL | Status: DC
Start: 2015-10-08 — End: 2016-09-09

## 2015-10-08 NOTE — Assessment & Plan Note (Signed)
Also with borderline low vitamin D.  Advised to start calcium with vitamin D supplementation.

## 2015-10-08 NOTE — Assessment & Plan Note (Signed)
Well managed on citalopram. Continue current regimen.

## 2015-10-08 NOTE — Progress Notes (Signed)
Subjective:    Patient ID: Bianca Lynch, female    DOB: 10-Dec-1955, 60 y.o.   MRN: [id number]  HPI  Bianca Lynch is a 60 year old female who presents today for complete physical.  Immunizations: -Tetanus: Completed in 2015 -Influenza: Completed in September 2016   Diet: Endorses a fair diet. Breakfast: Carnation instant breakfast Lunch: Left overs Dinner: Chicken, fish, pork chops, potatoes, beans, salad, corn, vegetables. Snacks: None Desserts: 5-7 times weekly Beverages: Coffee, water, wine   Exercise: She does not currently exercise. Eye exam: Completed 2 years ago. Denies changes in vision. Dental exam: Completes semi-annually Colonoscopy: Completed in  August 2016 with polyps. Repeat in 10 years Pap Smear: Follows with GYN.  Mammogram: Completed in April 2016, due again this year.    Review of Systems  Constitutional: Negative for unexpected weight change.  HENT: Negative for rhinorrhea.   Respiratory: Negative for cough and shortness of breath.   Cardiovascular: Negative for chest pain.  Gastrointestinal: Negative for diarrhea and constipation.  Genitourinary: Negative for difficulty urinating.  Musculoskeletal: Negative for myalgias and arthralgias.  Allergic/Immunologic: Positive for environmental allergies.  Neurological: Negative for dizziness, numbness and headaches.  Psychiatric/Behavioral:       Denies concerns for anxiety or depression.       Past Medical History  Diagnosis Date  . Menopausal symptom     HOT FLASHES  . Osteopenia   . Depression   . Insomnia     patient denies this at PAT appt 02/09/15  . Elevated cholesterol   . Thyroid disease     Hypothyroid  . Allergy     seasonal  . GERD (gastroesophageal reflux disease)   . Chicken pox   . Meniere disease   . Eczema     Social History   Social History  . Marital Status: Married    Spouse Name: N/A  . Number of Children: N/A  . Years of Education: N/A   Occupational History  .  Not on file.   Social History Main Topics  . Smoking status: Former Smoker    Quit date: 11/09/1999  . Smokeless tobacco: Never Used  . Alcohol Use: 8.4 oz/week    14 Standard drinks or equivalent per week     Comment: [place]   . Drug Use: No  . Sexual Activity: No   Other Topics Concern  . Not on file   Social History Narrative   Married.   No children.   Works as an Optometrist.   Enjoys bowling, play computer games, crossword puzzles       Past Surgical History  Procedure Laterality Date  . Tubal ligation  11/22/1998    [facility]/DR. [facility]  . Colonoscopy    . Wisdom tooth extraction      Family History  Problem Relation Age of Onset  . Hypertension Mother   . Heart disease Mother   . Cancer Mother     UTERINE  . Heart disease Father   . Breast cancer Paternal Aunt     age 54  . Colon cancer Neg Hx     No Known Allergies  Current Outpatient Prescriptions on File Prior to Visit  Medication Sig Dispense Refill  . cetirizine (ZYRTEC) 10 MG tablet Take 10 mg by mouth daily.    Marland Kitchen esomeprazole (NEXIUM) 20 MG capsule Take 20 mg by mouth daily at 12 noon.    . Multiple Vitamins-Minerals (CENTRUM SILVER PO) Take by mouth.  No current facility-administered medications on file prior to visit.    BP 124/70 mmHg  Pulse 61  Temp(Src) 98 F (36.7 C) (Oral)  Ht 5\' 1"  (1.549 m)  Wt 181 lb (82.101 kg)  BMI 34.22 kg/m2  SpO2 99%    Objective:   Physical Exam  Constitutional: She is oriented to person, place, and time. She appears well-nourished.  HENT:  Right Ear: Tympanic membrane and ear canal normal.  Left Ear: Tympanic membrane and ear canal normal.  Nose: Nose normal.  Mouth/Throat: Oropharynx is clear and moist.  Eyes: Conjunctivae and EOM are normal. Pupils are equal, round, and reactive to light.  Neck: Neck supple. No thyromegaly present.  Cardiovascular: Normal rate and regular rhythm.   No murmur heard. Pulmonary/Chest: Effort  normal and breath sounds normal. She has no rales.  Abdominal: Soft. Bowel sounds are normal. There is no tenderness.  Musculoskeletal: Normal range of motion.  Lymphadenopathy:    She has no cervical adenopathy.  Neurological: She is alert and oriented to person, place, and time. She has normal reflexes. No cranial nerve deficit.  Skin: Skin is warm and dry. No rash noted.  Psychiatric: She has a normal mood and affect.          Assessment & Plan:

## 2015-10-08 NOTE — Patient Instructions (Signed)
We've increased the dose of your Lipitor from 20 mg to 40 mg. You may take 2 of the 20 mg tablets until your bottle is empty. I've sent in a new prescription to your pharmacy.  I've sent in refills of your citalopram and levothyroxine.  It is important that you improve your diet. Please limit carbohydrates in the form of white bread, rice, pasta, cakes, cookies, sugary drinks, etc. Increase your consumption of fresh fruits and vegetables.  You need to consume about 2 liters (64 ounces) of water daily.  Start exercising. You should be getting 1 hour of moderate intensity exercise 5 days weekly.  Schedule a lab only appointment in 3 months for re-check of your cholesterol.  Follow up in 1 year for repeat physical or sooner if needed.  It was a pleasure to see you today!

## 2015-10-08 NOTE — Assessment & Plan Note (Signed)
Above goal on Lipitor 20 mg. Increase to Lipitor 40 mg. LFT's WNL. New RX sent to pharmacy.

## 2015-10-08 NOTE — Assessment & Plan Note (Signed)
Tdap and Flu UTD. Pap, mammogram, colonoscopy UTD. Exam unremarkable. Labs with elevated lipids and borderline low vitamin D that were addressed. Discussed the importance of a healthy diet and regular exercise in order for weight loss and to reduce risk of other medical diseases.  Follow up in 1 year for repeat physical.

## 2015-10-08 NOTE — Progress Notes (Signed)
Pre visit review using our clinic review tool, if applicable. No additional management support is needed unless otherwise documented below in the visit note. 

## 2015-10-08 NOTE — Assessment & Plan Note (Signed)
Repeat TSH stable. Continue levothyroxine 112 mcg.

## 2015-10-22 ENCOUNTER — Ambulatory Visit
Admission: RE | Admit: 2015-10-22 | Discharge: 2015-10-22 | Disposition: A | Discharge status: Home / Self Care | Payer: 59 | Source: Ambulatory Visit

## 2015-10-22 DIAGNOSIS — Z1231 Encounter for screening mammogram for malignant neoplasm of breast: Secondary | ICD-10-CM

## 2015-11-25 ENCOUNTER — Encounter: Payer: Self-pay | Admitting: Gynecology

## 2015-11-25 ENCOUNTER — Ambulatory Visit (INDEPENDENT_AMBULATORY_CARE_PROVIDER_SITE_OTHER): Payer: 59 | Admitting: Gynecology

## 2015-11-25 VITALS — BP 128/76 | Ht 60.0 in | Wt 178.0 lb

## 2015-11-25 DIAGNOSIS — Z780 Asymptomatic menopausal state: Secondary | ICD-10-CM

## 2015-11-25 DIAGNOSIS — Z01419 Encounter for gynecological examination (general) (routine) without abnormal findings: Secondary | ICD-10-CM

## 2015-11-25 NOTE — Patient Instructions (Signed)

## 2015-11-25 NOTE — Progress Notes (Signed)
Bianca Lynch Jan 18, 1956 [id number]   History:    60 y.o.  presented to the office for her annual exam today with no complaints today. Patient is menopausal has never been on hormonal replacement therapy. Review of her record indicated that back in 2015 she had an abnormal Pap smear which had demonstrated the following: Normal  Ancillary Testing HPV 16,18/45 Genotyping NEGATIVE for HPV 16 & 18/45 Completed by [id number] on 2013-11-17 HPV High Risk **DETECTED**  She consequently underwent a colposcopic evaluation with no lesions seen and ECC was benign. Her PCP is in [facility] was been doing her blood work. Her bone density study was normal in 2015. She had a normal colonoscopy in 2016.  Past medical history,surgical history, family history and social history were all reviewed and documented in the EPIC chart.  Gynecologic History No LMP recorded. Patient is postmenopausal. Contraception: post menopausal status Last Pap: 2015. Results were: See above Last mammogram: 2017. Results were: normal  Obstetric History OB History  Gravida Para Term Preterm AB SAB TAB Ectopic Multiple Living  0                  ROS: A ROS was performed and pertinent positives and negatives are included in the history.  GENERAL: No fevers or chills. HEENT: No change in vision, no earache, sore throat or sinus congestion. NECK: No pain or stiffness. CARDIOVASCULAR: No chest pain or pressure. No palpitations. PULMONARY: No shortness of breath, cough or wheeze. GASTROINTESTINAL: No abdominal pain, nausea, vomiting or diarrhea, melena or bright red blood per rectum. GENITOURINARY: No urinary frequency, urgency, hesitancy or dysuria. MUSCULOSKELETAL: No joint or muscle pain, no back pain, no recent trauma. DERMATOLOGIC: No rash, no itching, no lesions. ENDOCRINE: No polyuria, polydipsia, no heat or cold intolerance. No recent change in weight. HEMATOLOGICAL: No anemia or easy bruising or bleeding.  NEUROLOGIC: No headache, seizures, numbness, tingling or weakness. PSYCHIATRIC: No depression, no loss of interest in normal activity or change in sleep pattern.     Exam: chaperone present  BP 128/76 mmHg  Ht 5' (1.524 m)  Wt 178 lb (80.74 kg)  BMI 34.76 kg/m2  Body mass index is 34.76 kg/(m^2).  General appearance : Well developed well nourished female. No acute distress HEENT: Eyes: no retinal hemorrhage or exudates,  Neck supple, trachea midline, no carotid bruits, no thyroidmegaly Lungs: Clear to auscultation, no rhonchi or wheezes, or rib retractions  Heart: Regular rate and rhythm, no murmurs or gallops Breast:Examined in sitting and supine position were symmetrical in appearance, no palpable masses or tenderness,  no skin retraction, no nipple inversion, no nipple discharge, no skin discoloration, no axillary or supraclavicular lymphadenopathy Abdomen: no palpable masses or tenderness, no rebound or guarding Extremities: no edema or skin discoloration or tenderness  Pelvic:  Bartholin, Urethra, Skene Glands: Within normal limits             Vagina: No gross lesions or dischargeanteverted, atrophic changes  Cervix: No gross lesions or discharge  Uterus  anteverted, normal size, shape and consistency, non-tender and mobile  Adnexa  Without masses or tenderness  Anus and perineum  normal   Rectovaginal  normal sphincter tone without palpated masses or tenderness             Hemoccult PCP provides     Assessment/Plan:  60 y.o. female for annual exam menopausal no hormone replacement therapy we discussed importance of calcium and vitamin D for osteoporosis prevention. Patient to schedule  her bone density study for June of this year. Pap smear with HPV screening was done today. Her PCP is doing her blood work. We discussed importance of monthly breast exams. We discussed importance of calcium vitamin D and weightbearing exercises for osteoporosis prevention.   Terrance Mass  MD, 11:19 AM 11/25/2015

## 2015-11-29 LAB — PAP, TP IMAGING W/ HPV RNA, RFLX HPV TYPE 16,18/45: HPV mRNA, High Risk: DETECTED — AB

## 2015-12-01 LAB — HPV TYPE 16 AND 18/45 RNA
HPV TYPE 16 RNA: NOT DETECTED
HPV Type 18/45 RNA: NOT DETECTED

## 2015-12-14 ENCOUNTER — Encounter: Payer: Self-pay | Admitting: Gynecology

## 2015-12-14 ENCOUNTER — Ambulatory Visit (INDEPENDENT_AMBULATORY_CARE_PROVIDER_SITE_OTHER): Payer: 59 | Admitting: Gynecology

## 2015-12-14 VITALS — BP 136/88

## 2015-12-14 DIAGNOSIS — R8789 Other abnormal findings in specimens from female genital organs: Secondary | ICD-10-CM

## 2015-12-14 DIAGNOSIS — IMO0002 Reserved for concepts with insufficient information to code with codable children: Secondary | ICD-10-CM

## 2015-12-14 DIAGNOSIS — B977 Papillomavirus as the cause of diseases classified elsewhere: Secondary | ICD-10-CM

## 2015-12-14 NOTE — Progress Notes (Signed)
Patient presented to the office to discuss recent pap smear and further management. Her history is as follows:  2015: Pap Normal Ancillary Testing HPV 16,18/45 Genotyping NEGATIVE for HPV 16 & 18/45 Completed by [id number] on 2013-11-17 HPV High Risk **DETECTED**  She consequently underwent a colposcopic evaluation with no lesions seen and ECC was benign. Her PCP is in [facility] was been doing her blood work. Her bone density study was normal in 2015. She had a normal colonoscopy in 2016.  2017 PAP Normal Ancillary Testing HPV 16,18/45 Genotyping NEGATIVE for HPV 16 & 18/45 Completed by [id number] on 2013-11-17 HPV High Risk **DETECTED*  According to the ASCCP guidelines is to repeat PAP with HPY Screen in 1 year. This was related to patient. All questions answered.

## 2016-01-06 ENCOUNTER — Other Ambulatory Visit: Payer: Self-pay | Admitting: Primary Care

## 2016-01-13 ENCOUNTER — Other Ambulatory Visit (INDEPENDENT_AMBULATORY_CARE_PROVIDER_SITE_OTHER): Payer: 59

## 2016-01-13 DIAGNOSIS — E785 Hyperlipidemia, unspecified: Secondary | ICD-10-CM | POA: Diagnosis not present

## 2016-01-13 LAB — LIPID PANEL
CHOLESTEROL: 211 mg/dL — AB (ref 0–200)
HDL: 54 mg/dL (ref >39.00)
NonHDL: 156.58
Total CHOL/HDL Ratio: 4
Triglycerides: 216 mg/dL — ABNORMAL HIGH (ref 0.0–149.0)
VLDL: 43.2 mg/dL — ABNORMAL HIGH (ref 0.0–40.0)

## 2016-01-13 LAB — LDL CHOLESTEROL, DIRECT: LDL DIRECT: 127 mg/dL

## 2016-01-18 ENCOUNTER — Ambulatory Visit (INDEPENDENT_AMBULATORY_CARE_PROVIDER_SITE_OTHER): Payer: 59

## 2016-01-18 ENCOUNTER — Other Ambulatory Visit: Payer: Self-pay | Admitting: Gynecology

## 2016-01-18 DIAGNOSIS — Z780 Asymptomatic menopausal state: Secondary | ICD-10-CM

## 2016-07-25 DIAGNOSIS — H532 Diplopia: Secondary | ICD-10-CM | POA: Diagnosis not present

## 2016-09-09 ENCOUNTER — Other Ambulatory Visit: Payer: Self-pay | Admitting: Primary Care

## 2016-09-09 DIAGNOSIS — E785 Hyperlipidemia, unspecified: Secondary | ICD-10-CM

## 2016-09-29 ENCOUNTER — Other Ambulatory Visit: Payer: Self-pay | Admitting: Primary Care

## 2016-09-29 DIAGNOSIS — E785 Hyperlipidemia, unspecified: Secondary | ICD-10-CM

## 2016-09-29 DIAGNOSIS — R7303 Prediabetes: Secondary | ICD-10-CM

## 2016-09-29 DIAGNOSIS — M8580 Other specified disorders of bone density and structure, unspecified site: Secondary | ICD-10-CM

## 2016-10-05 ENCOUNTER — Other Ambulatory Visit (INDEPENDENT_AMBULATORY_CARE_PROVIDER_SITE_OTHER): Payer: 59

## 2016-10-05 DIAGNOSIS — R7303 Prediabetes: Secondary | ICD-10-CM

## 2016-10-05 DIAGNOSIS — E785 Hyperlipidemia, unspecified: Secondary | ICD-10-CM | POA: Diagnosis not present

## 2016-10-05 DIAGNOSIS — M8580 Other specified disorders of bone density and structure, unspecified site: Secondary | ICD-10-CM

## 2016-10-05 LAB — LIPID PANEL
CHOL/HDL RATIO: 4
CHOLESTEROL: 186 mg/dL (ref 0–200)
HDL: 52 mg/dL (ref >39.00)
LDL CALC: 104 mg/dL — AB (ref 0–99)
NonHDL: 134.31
TRIGLYCERIDES: 151 mg/dL — AB (ref 0.0–149.0)
VLDL: 30.2 mg/dL (ref 0.0–40.0)

## 2016-10-05 LAB — COMPREHENSIVE METABOLIC PANEL
ALBUMIN: 4.3 g/dL (ref 3.5–5.2)
ALT: 21 U/L (ref 0–35)
AST: 26 U/L (ref 0–37)
Alkaline Phosphatase: 50 U/L (ref 39–117)
BUN: 17 mg/dL (ref 6–23)
CHLORIDE: 104 meq/L (ref 96–112)
CO2: 32 meq/L (ref 19–32)
Calcium: 9.2 mg/dL (ref 8.4–10.5)
Creatinine, Ser: 0.86 mg/dL (ref 0.40–1.20)
GFR: 71.43 mL/min (ref >60.00)
Glucose, Bld: 99 mg/dL (ref 70–99)
POTASSIUM: 4.3 meq/L (ref 3.5–5.1)
Sodium: 141 mEq/L (ref 135–145)
Total Bilirubin: 0.6 mg/dL (ref 0.2–1.2)
Total Protein: 6.9 g/dL (ref 6.0–8.3)

## 2016-10-05 LAB — VITAMIN D 25 HYDROXY (VIT D DEFICIENCY, FRACTURES): VITD: 31.76 ng/mL (ref 30.00–100.00)

## 2016-10-05 LAB — HEMOGLOBIN A1C: Hgb A1c MFr Bld: 5.7 % (ref 4.6–6.5)

## 2016-10-10 ENCOUNTER — Ambulatory Visit (INDEPENDENT_AMBULATORY_CARE_PROVIDER_SITE_OTHER): Payer: 59 | Admitting: Primary Care

## 2016-10-10 ENCOUNTER — Other Ambulatory Visit: Payer: Self-pay | Admitting: Primary Care

## 2016-10-10 ENCOUNTER — Encounter: Payer: Self-pay | Admitting: Primary Care

## 2016-10-10 VITALS — BP 126/72 | HR 65 | Temp 98.2°F | Ht 60.0 in | Wt 184.1 lb

## 2016-10-10 DIAGNOSIS — R7303 Prediabetes: Secondary | ICD-10-CM

## 2016-10-10 DIAGNOSIS — E7800 Pure hypercholesterolemia, unspecified: Secondary | ICD-10-CM

## 2016-10-10 DIAGNOSIS — E079 Disorder of thyroid, unspecified: Secondary | ICD-10-CM

## 2016-10-10 DIAGNOSIS — E785 Hyperlipidemia, unspecified: Secondary | ICD-10-CM

## 2016-10-10 DIAGNOSIS — E039 Hypothyroidism, unspecified: Secondary | ICD-10-CM

## 2016-10-10 DIAGNOSIS — R87618 Other abnormal cytological findings on specimens from cervix uteri: Secondary | ICD-10-CM

## 2016-10-10 DIAGNOSIS — Z0000 Encounter for general adult medical examination without abnormal findings: Secondary | ICD-10-CM | POA: Diagnosis not present

## 2016-10-10 DIAGNOSIS — F3342 Major depressive disorder, recurrent, in full remission: Secondary | ICD-10-CM

## 2016-10-10 HISTORY — DX: Prediabetes: R73.03

## 2016-10-10 LAB — TSH: TSH: 2.35 u[IU]/mL (ref 0.35–4.50)

## 2016-10-10 MED ORDER — LEVOTHYROXINE SODIUM 112 MCG PO TABS: 112.0000 ug | ORAL_TABLET | Freq: Every day | ORAL | 3 refills | Status: DC

## 2016-10-10 MED ORDER — CITALOPRAM HYDROBROMIDE 20 MG PO TABS: 20.0000 mg | ORAL_TABLET | Freq: Every day | ORAL | 3 refills | Status: DC

## 2016-10-10 MED ORDER — ATORVASTATIN CALCIUM 40 MG PO TABS: 40.0000 mg | ORAL_TABLET | Freq: Every day | ORAL | 3 refills | Status: DC

## 2016-10-10 NOTE — Assessment & Plan Note (Signed)
Following with GYN

## 2016-10-10 NOTE — Patient Instructions (Addendum)
Complete lab work prior to leaving today. I will notify you of your results once received.   It's important to improve your diet by reducing consumption processed snack foods, sugary drinks, candy. Increase consumption of fresh vegetables and fruits, whole grains, water.  Ensure you are drinking 64 ounces of water daily.  Start exercising. You should be getting 150 minutes of moderate intensity exercise weekly.  Try taking ranitidine (Zantac) either as needed or daily for acid reflux symptoms. Try to avoid the Nexium if possible. Keep me updated.   We will continue to monitor your blood sugar level.  Schedule a lab only appointment in 6 months to recheck your sugar level. You do not have to fast for this.   Follow up in 1 year for your annual exam or sooner if needed.  It was a pleasure to see you today!   Food Choices for Gastroesophageal Reflux Disease, Adult When you have gastroesophageal reflux disease (GERD), the foods you eat and your eating habits are very important. Choosing the right foods can help ease your discomfort. What guidelines do I need to follow?  Choose fruits, vegetables, whole grains, and low-fat dairy products.  Choose low-fat meat, fish, and poultry.  Limit fats such as oils, salad dressings, butter, nuts, and avocado.  Keep a food diary. This helps you identify foods that cause symptoms.  Avoid foods that cause symptoms. These may be different for everyone.  Eat small meals often instead of 3 large meals a day.  Eat your meals slowly, in a place where you are relaxed.  Limit fried foods.  Cook foods using methods other than frying.  Avoid drinking alcohol.  Avoid drinking large amounts of liquids with your meals.  Avoid bending over or lying down until 2-3 hours after eating. What foods are not recommended? These are some foods and drinks that may make your symptoms worse: Vegetables  Tomatoes. Tomato juice. Tomato and spaghetti sauce. Chili  peppers. Onion and garlic. Horseradish. Fruits  Oranges, grapefruit, and lemon (fruit and juice). Meats  High-fat meats, fish, and poultry. This includes hot dogs, ribs, ham, sausage, salami, and bacon. Dairy  Whole milk and chocolate milk. Sour cream. Cream. Butter. Ice cream. Cream cheese. Drinks  Coffee and tea. Bubbly (carbonated) drinks or energy drinks. Condiments  Hot sauce. Barbecue sauce. Sweets/Desserts  Chocolate and cocoa. Donuts. Peppermint and spearmint. Fats and Oils  High-fat foods. This includes [place] fries and potato chips. Other  Vinegar. Strong spices. This includes black pepper, white pepper, red pepper, cayenne, curry powder, cloves, ginger, and chili powder. The items listed above may not be a complete list of foods and drinks to avoid. Contact your dietitian for more information.  This information is not intended to replace advice given to you by your health care provider. Make sure you discuss any questions you have with your health care provider. Document Released: 12/26/2011 Document Revised: 12/02/2015 Document Reviewed: 04/30/2013 Elsevier Interactive Patient Education  2017 [employer].

## 2016-10-10 NOTE — Assessment & Plan Note (Addendum)
Due for TSH check today, pending. Continue levothyroxine 112 mcg, will adjust if necessary.

## 2016-10-10 NOTE — Assessment & Plan Note (Addendum)
Continue on atorvastatin 40. Recent lipid panel stable.

## 2016-10-10 NOTE — Assessment & Plan Note (Signed)
Immunizations UTD. Pap UTD, Follows with GYN. Mammogram due this Spring, will schedule. Colonoscopy UTD. Bone density UTD.  Discussed the importance of a healthy diet and regular exercise in order for weight loss, and to reduce the risk of other medical diseases. Exam unremarkable. Labs pending. Follow up in 1 year for annual exam.

## 2016-10-10 NOTE — Assessment & Plan Note (Signed)
Recent A1C of 5.7, will repeat in 6 months and closely monitor.

## 2016-10-10 NOTE — Progress Notes (Signed)
Pre visit review using our clinic review tool, if applicable. No additional management support is needed unless otherwise documented below in the visit note. 

## 2016-10-10 NOTE — Assessment & Plan Note (Signed)
Doing well on citalopram. Continue current regimen. Denies SI/HI.

## 2016-10-10 NOTE — Progress Notes (Signed)
Subjective:    Patient ID: Bianca Lynch, female    DOB: [birth year], 61 y.o.   MRN: [id number]  HPI  [name] is a 61 year old female who presents today for complete physical.  Immunizations: -Tetanus: Completed in 2015 -Influenza: Completed in September 2017 -Shingles: Completed four years ago per GYN.  Diet: She endorses a fair diet.  Breakfast: Carnation instant breakfast Lunch: Left overs Dinner: Meat, vegetables, starch Snacks: Candy Desserts: Daily candy, once weekly dessert Beverages: Coffee, water, occasional soda, wine  Exercise: She does not currently exercise Eye exam: Completed in January 2018, due again in June 2018 Dental exam: Completes every 6 months Colonoscopy: Completed in 2016, due in 2026 Pap Smear: Completed in 2017. Follows with GYN. Mammogram: Completed in 2017, normal.  Bone Density: Completed in 2017, osteopenia.   Review of Systems  Constitutional: Negative for unexpected weight change.  HENT: Negative for rhinorrhea.   Respiratory: Negative for cough and shortness of breath.   Cardiovascular: Negative for chest pain.  Gastrointestinal: Negative for constipation and diarrhea.  Genitourinary: Negative for difficulty urinating and menstrual problem.  Musculoskeletal: Negative for arthralgias and myalgias.  Skin: Negative for rash.  Allergic/Immunologic: Negative for environmental allergies.  Neurological: Negative for dizziness, numbness and headaches.  Psychiatric/Behavioral:       Feels well managed on Citalopram.       Past Medical History:  Diagnosis Date  . Allergy    seasonal  . Chicken pox   . Depression   . Eczema   . Elevated cholesterol   . GERD (gastroesophageal reflux disease)   . Insomnia    patient denies this at PAT appt 02/09/15  . Meniere disease   . Menopausal symptom    HOT FLASHES  . Osteopenia   . Thyroid disease    Hypothyroid     Social History   Social History  . Marital status: Married    Spouse  name: N/A  . Number of children: N/A  . Years of education: N/A   Occupational History  . Not on file.   Social History Main Topics  . Smoking status: Former Smoker    Quit date: 11/09/1999  . Smokeless tobacco: Never Used  . Alcohol use 8.4 oz/week    14 Standard drinks or equivalent per week     Comment: [place]   . Drug use: No  . Sexual activity: No   Other Topics Concern  . Not on file   Social History Narrative   Married.   No children.   Works as an Optometrist.   Enjoys bowling, play computer games, crossword puzzles       Past Surgical History:  Procedure Laterality Date  . COLONOSCOPY    . TUBAL LIGATION  11/22/1998   [facility]/DR. GOTT[facility]  . WISDOM TOOTH EXTRACTION      Family History  Problem Relation Age of Onset  . Hypertension Mother   . Heart disease Mother   . Cancer Mother     UTERINE  . Heart disease Father   . Breast cancer Paternal Aunt     age 68  . Colon cancer Neg Hx     No Known Allergies  Current Outpatient Prescriptions on File Prior to Visit  Medication Sig Dispense Refill  . atorvastatin (LIPITOR) 40 MG tablet Take 1 tablet (40 mg total) by mouth daily. Need office visit for further REFILLS 90 tablet 0  . Calcium Carb-Cholecalciferol (CALCIUM 600 + D PO) Take by  mouth.    . cetirizine (ZYRTEC) 10 MG tablet Take 10 mg by mouth daily.    . cholecalciferol (VITAMIN D) 1000 units tablet Take 1,000 Units by mouth daily.    . citalopram (CELEXA) 20 MG tablet Take 1 tablet (20 mg total) by mouth daily. 90 tablet 3  . esomeprazole (NEXIUM) 20 MG capsule Take 20 mg by mouth daily at 12 noon.    Marland Kitchen levothyroxine (SYNTHROID, LEVOTHROID) 112 MCG tablet Take 1 tablet (112 mcg total) by mouth daily. 90 tablet 3  . Multiple Vitamins-Minerals (CENTRUM SILVER PO) Take by mouth.     No current facility-administered medications on file prior to visit.     BP 126/72   Pulse 65   Temp 98.2 F (36.8 C) (Oral)   Ht 5' (1.524 m)   Wt 184  lb 1.9 oz (83.5 kg)   SpO2 99%   BMI 35.96 kg/m    Objective:   Physical Exam  Constitutional: She is oriented to person, place, and time. She appears well-nourished.  HENT:  Right Ear: Tympanic membrane and ear canal normal.  Left Ear: Tympanic membrane and ear canal normal.  Nose: Nose normal.  Mouth/Throat: Oropharynx is clear and moist.  Mild cerumen buildup to bilateral canals  Eyes: Conjunctivae and EOM are normal. Pupils are equal, round, and reactive to light.  Neck: Neck supple. No thyromegaly present.  Cardiovascular: Normal rate and regular rhythm.   No murmur heard. Pulmonary/Chest: Effort normal and breath sounds normal. She has no rales.  Abdominal: Soft. Bowel sounds are normal. There is no tenderness.  Musculoskeletal: Normal range of motion.  Lymphadenopathy:    She has no cervical adenopathy.  Neurological: She is alert and oriented to person, place, and time. She has normal reflexes. No cranial nerve deficit.  Skin: Skin is warm and dry. No rash noted.  Psychiatric: She has a normal mood and affect.          Assessment & Plan:

## 2016-10-10 NOTE — Assessment & Plan Note (Signed)
Bone density in 2017. Continue calcium with vitamin D.

## 2016-11-22 ENCOUNTER — Encounter: Payer: Self-pay | Admitting: Gynecology

## 2016-11-29 ENCOUNTER — Encounter: Payer: Self-pay | Admitting: Gynecology

## 2016-11-29 ENCOUNTER — Ambulatory Visit (INDEPENDENT_AMBULATORY_CARE_PROVIDER_SITE_OTHER): Payer: 59 | Admitting: Gynecology

## 2016-11-29 ENCOUNTER — Other Ambulatory Visit: Payer: Self-pay | Admitting: Gynecology

## 2016-11-29 VITALS — BP 122/80 | Ht 61.0 in | Wt 183.0 lb

## 2016-11-29 DIAGNOSIS — Z01419 Encounter for gynecological examination (general) (routine) without abnormal findings: Secondary | ICD-10-CM

## 2016-11-29 DIAGNOSIS — Z87898 Personal history of other specified conditions: Secondary | ICD-10-CM

## 2016-11-29 DIAGNOSIS — Z1231 Encounter for screening mammogram for malignant neoplasm of breast: Secondary | ICD-10-CM

## 2016-11-29 DIAGNOSIS — Z1151 Encounter for screening for human papillomavirus (HPV): Secondary | ICD-10-CM | POA: Diagnosis not present

## 2016-11-29 DIAGNOSIS — Z8742 Personal history of other diseases of the female genital tract: Secondary | ICD-10-CM

## 2016-11-29 NOTE — Progress Notes (Signed)
Bianca Lynch 11-Feb-1956 [id number]   History:    61 y.o.  for annual gyn exam with no complaints today. Patient was seen in the office in June 2017 as a result of her past Pap smear history as follows:  2015: Pap Normal Ancillary Testing HPV 16,18/45 Genotyping NEGATIVE for HPV 16 & 18/45 Completed by [id number] on 2013-11-17 HPV High Risk **DETECTED**  She consequently underwent a colposcopic evaluation with no lesions seen and ECC was benign. Her PCP is in [facility] was been doing her blood work. Her bone density study was normal in 2015. She had a normal colonoscopy in 2016.  2017 PAP Normal Ancillary Testing HPV 16,18/45 Genotyping NEGATIVE for HPV 16 & 18/45 Completed by [id number] on 2013-11-17 HPV High Risk **DETECTED*  Patient has never been on any hormone replacement therapy and reports no vaginal bleeding. She had a colonoscopy in 2016 which was normal. Her bone density study was normal in 2017.  Past medical history,surgical history, family history and social history were all reviewed and documented in the EPIC chart.  Gynecologic History No LMP recorded. Patient is postmenopausal. Contraception: post menopausal status Last Pap: 2017. Results were: See above Last mammogram: 2017. Results were: normal  Obstetric History OB History  Gravida Para Term Preterm AB Living  0            SAB TAB Ectopic Multiple Live Births                    ROS: A ROS was performed and pertinent positives and negatives are included in the history.  GENERAL: No fevers or chills. HEENT: No change in vision, no earache, sore throat or sinus congestion. NECK: No pain or stiffness. CARDIOVASCULAR: No chest pain or pressure. No palpitations. PULMONARY: No shortness of breath, cough or wheeze. GASTROINTESTINAL: No abdominal pain, nausea, vomiting or diarrhea, melena or bright red blood per rectum. GENITOURINARY: No urinary frequency, urgency, hesitancy or dysuria.  MUSCULOSKELETAL: No joint or muscle pain, no back pain, no recent trauma. DERMATOLOGIC: No rash, no itching, no lesions. ENDOCRINE: No polyuria, polydipsia, no heat or cold intolerance. No recent change in weight. HEMATOLOGICAL: No anemia or easy bruising or bleeding. NEUROLOGIC: No headache, seizures, numbness, tingling or weakness. PSYCHIATRIC: No depression, no loss of interest in normal activity or change in sleep pattern.     Exam: chaperone present  BP 122/80   Ht 5\' 1"  (1.549 m)   Wt 183 lb (83 kg)   BMI 34.58 kg/m   Body mass index is 34.58 kg/m.  General appearance : Well developed well nourished female. No acute distress HEENT: Eyes: no retinal hemorrhage or exudates,  Neck supple, trachea midline, no carotid bruits, no thyroidmegaly Lungs: Clear to auscultation, no rhonchi or wheezes, or rib retractions  Heart: Regular rate and rhythm, no murmurs or gallops Breast:Examined in sitting and supine position were symmetrical in appearance, no palpable masses or tenderness,  no skin retraction, no nipple inversion, no nipple discharge, no skin discoloration, no axillary or supraclavicular lymphadenopathy Abdomen: no palpable masses or tenderness, no rebound or guarding Extremities: no edema or skin discoloration or tenderness  Pelvic:  Bartholin, Urethra, Skene Glands: Within normal limits             Vagina: No gross lesions or discharge  Cervix: No gross lesions or discharge  Uterus  anteverted, normal size, shape and consistency, non-tender and mobile  Adnexa  Without masses or tenderness  Anus and perineum  normal   Rectovaginal  normal sphincter tone without palpated masses or tenderness             Hemoccult cards will be provided     Assessment/Plan:  61 y.o. female for annual exam with negative colposcopy last year Pap smear was normal but high-risk HPV had been detected. But not 16, 18 and 45. Pap smear with HPV screening done today. PCP has been doing her blood work.  Patient with a bone density study next year. Patient due for mammogram. We discussed importance of calcium vitamin D and weightbearing exercises for osteoporosis prevention.   Terrance Mass MD, 11:02 AM 11/29/2016

## 2016-11-29 NOTE — Addendum Note (Signed)
Addended by: Nelva Nay on: 11/29/2016 11:32 AM   Modules accepted: Orders

## 2016-12-05 LAB — PAP, TP IMAGING W/ HPV RNA, RFLX HPV TYPE 16,18/45: HPV mRNA, High Risk: NOT DETECTED

## 2016-12-13 ENCOUNTER — Ambulatory Visit
Admission: RE | Admit: 2016-12-13 | Discharge: 2016-12-13 | Disposition: A | Discharge status: Home / Self Care | Payer: 59 | Source: Ambulatory Visit | Attending: Gynecology | Admitting: Gynecology

## 2016-12-13 DIAGNOSIS — Z1231 Encounter for screening mammogram for malignant neoplasm of breast: Secondary | ICD-10-CM

## 2016-12-25 DIAGNOSIS — H9113 Presbycusis, bilateral: Secondary | ICD-10-CM | POA: Diagnosis not present

## 2016-12-29 DIAGNOSIS — H2513 Age-related nuclear cataract, bilateral: Secondary | ICD-10-CM | POA: Diagnosis not present

## 2016-12-29 DIAGNOSIS — H43813 Vitreous degeneration, bilateral: Secondary | ICD-10-CM | POA: Diagnosis not present

## 2017-01-03 ENCOUNTER — Encounter: Payer: Self-pay | Admitting: Primary Care

## 2017-01-05 ENCOUNTER — Telehealth: Payer: Self-pay | Admitting: Primary Care

## 2017-01-05 MED ORDER — HYDROXYZINE HCL 10 MG PO TABS: 10.0000 mg | ORAL_TABLET | Freq: Two times a day (BID) | ORAL | 0 refills | Status: DC | PRN

## 2017-01-05 NOTE — Telephone Encounter (Signed)
Patient left voicemail regarding reply for this message through [employer].  Patient is agreeable to the Atarax. Patient ask to send the medication to [facility] on [address].

## 2017-01-05 NOTE — Telephone Encounter (Signed)
Pt returned your call - please call cell phone [phone] Thanks

## 2017-01-05 NOTE — Telephone Encounter (Signed)
Message left for patient to return my call.  

## 2017-01-05 NOTE — Telephone Encounter (Signed)
Atarax sent to pharmacy

## 2017-01-08 NOTE — Telephone Encounter (Addendum)
Spoken to patient notified her that Rx has been sent.

## 2017-01-08 NOTE — Telephone Encounter (Signed)
Spoken to patient this morning and notified that Rx was sent pharmacy then she hung up the phone before I could finish.

## 2017-01-31 ENCOUNTER — Other Ambulatory Visit: Payer: Self-pay | Admitting: Internal Medicine

## 2017-01-31 NOTE — Telephone Encounter (Signed)
Patient stated that she does not need refill at this time.

## 2017-01-31 NOTE — Telephone Encounter (Signed)
Does she need this refilled or was this an automatic refill?

## 2017-01-31 NOTE — Telephone Encounter (Signed)
Last filled 01/05/17 by Garnette Gunner-- please advise... See email from 01/02/17

## 2017-04-04 ENCOUNTER — Other Ambulatory Visit: Payer: Self-pay | Admitting: Primary Care

## 2017-04-04 DIAGNOSIS — R7303 Prediabetes: Secondary | ICD-10-CM

## 2017-04-06 DIAGNOSIS — H903 Sensorineural hearing loss, bilateral: Secondary | ICD-10-CM | POA: Diagnosis not present

## 2017-04-10 ENCOUNTER — Other Ambulatory Visit (INDEPENDENT_AMBULATORY_CARE_PROVIDER_SITE_OTHER): Payer: 59

## 2017-04-10 DIAGNOSIS — R7303 Prediabetes: Secondary | ICD-10-CM

## 2017-04-10 LAB — HEMOGLOBIN A1C: Hgb A1c MFr Bld: 5.6 % (ref 4.6–6.5)

## 2017-04-12 DIAGNOSIS — Z23 Encounter for immunization: Secondary | ICD-10-CM | POA: Diagnosis not present

## 2017-04-17 ENCOUNTER — Encounter: Payer: Self-pay | Admitting: Primary Care

## 2017-04-17 DIAGNOSIS — R432 Parageusia: Secondary | ICD-10-CM

## 2017-04-20 ENCOUNTER — Other Ambulatory Visit (INDEPENDENT_AMBULATORY_CARE_PROVIDER_SITE_OTHER): Payer: 59

## 2017-04-20 DIAGNOSIS — R432 Parageusia: Secondary | ICD-10-CM | POA: Diagnosis not present

## 2017-04-20 LAB — VITAMIN B12: VITAMIN B 12: 465 pg/mL (ref 211–911)

## 2017-05-09 ENCOUNTER — Encounter: Payer: Self-pay | Admitting: Primary Care

## 2017-05-09 DIAGNOSIS — E039 Hypothyroidism, unspecified: Secondary | ICD-10-CM

## 2017-06-21 DIAGNOSIS — E538 Deficiency of other specified B group vitamins: Secondary | ICD-10-CM | POA: Diagnosis not present

## 2017-06-21 DIAGNOSIS — E890 Postprocedural hypothyroidism: Secondary | ICD-10-CM | POA: Diagnosis not present

## 2017-06-26 DIAGNOSIS — K146 Glossodynia: Secondary | ICD-10-CM | POA: Diagnosis not present

## 2017-06-26 DIAGNOSIS — E0500 Thyrotoxicosis with diffuse goiter without thyrotoxic crisis or storm: Secondary | ICD-10-CM | POA: Diagnosis not present

## 2017-06-26 DIAGNOSIS — E890 Postprocedural hypothyroidism: Secondary | ICD-10-CM | POA: Diagnosis not present

## 2017-09-03 ENCOUNTER — Other Ambulatory Visit: Payer: Self-pay | Admitting: Primary Care

## 2017-09-03 DIAGNOSIS — F3342 Major depressive disorder, recurrent, in full remission: Secondary | ICD-10-CM

## 2017-09-04 DIAGNOSIS — Z23 Encounter for immunization: Secondary | ICD-10-CM | POA: Diagnosis not present

## 2017-09-21 ENCOUNTER — Telehealth: Payer: Self-pay | Admitting: Primary Care

## 2017-09-21 DIAGNOSIS — E039 Hypothyroidism, unspecified: Secondary | ICD-10-CM

## 2017-09-21 MED ORDER — LEVOTHYROXINE SODIUM 112 MCG PO TABS
112.0000 ug | ORAL_TABLET | Freq: Every day | ORAL | 0 refills | Status: DC
Start: 2017-09-21 — End: 2017-10-11

## 2017-09-21 NOTE — Telephone Encounter (Signed)
I called pt to let her know I sent in a 30 day supply of her Synthroid to [facility] on [address].   Her husband Rush Landmark answered the phone.   He is on the DPR so I let him know what I had done.  He thanked me and will let her know.

## 2017-09-21 NOTE — Telephone Encounter (Signed)
Copied from [place] [id number]. Topic: Quick Communication - Rx Refill/Question >> Sep 21, 2017  1:34 PM Arletha Grippe wrote: Medication:  levothyroxine (SYNTHROID, LEVOTHROID) 112 MCG tablet - pt has 90 day supply, bur she hasnt received her refill.  It says pending on [facility] end.  She will be out and is asking for a 30 day supply sent to local pharm  Has the patient contacted their pharmacy? Yes.     (Agent: If no, request that the patient contact the pharmacy for the refill.)   Preferred Pharmacy (with phone number or street name[facility] [address]    Agent: Please be advised that RX refills may take up to 3 business days. We ask that you follow-up with your pharmacy.

## 2017-09-26 ENCOUNTER — Other Ambulatory Visit: Payer: Self-pay | Admitting: Primary Care

## 2017-09-26 DIAGNOSIS — E7800 Pure hypercholesterolemia, unspecified: Secondary | ICD-10-CM

## 2017-09-26 DIAGNOSIS — R7303 Prediabetes: Secondary | ICD-10-CM

## 2017-09-26 DIAGNOSIS — E079 Disorder of thyroid, unspecified: Secondary | ICD-10-CM

## 2017-10-02 ENCOUNTER — Other Ambulatory Visit: Payer: Self-pay | Admitting: Primary Care

## 2017-10-02 ENCOUNTER — Other Ambulatory Visit (INDEPENDENT_AMBULATORY_CARE_PROVIDER_SITE_OTHER): Payer: 59

## 2017-10-02 DIAGNOSIS — E039 Hypothyroidism, unspecified: Secondary | ICD-10-CM

## 2017-10-02 DIAGNOSIS — E079 Disorder of thyroid, unspecified: Secondary | ICD-10-CM

## 2017-10-02 DIAGNOSIS — R7303 Prediabetes: Secondary | ICD-10-CM | POA: Diagnosis not present

## 2017-10-02 DIAGNOSIS — E7800 Pure hypercholesterolemia, unspecified: Secondary | ICD-10-CM | POA: Diagnosis not present

## 2017-10-02 LAB — COMPREHENSIVE METABOLIC PANEL
ALK PHOS: 59 U/L (ref 39–117)
ALT: 19 U/L (ref 0–35)
AST: 20 U/L (ref 0–37)
Albumin: 4.2 g/dL (ref 3.5–5.2)
BILIRUBIN TOTAL: 0.6 mg/dL (ref 0.2–1.2)
BUN: 11 mg/dL (ref 6–23)
CO2: 29 meq/L (ref 19–32)
Calcium: 9.2 mg/dL (ref 8.4–10.5)
Chloride: 104 mEq/L (ref 96–112)
Creatinine, Ser: 0.89 mg/dL (ref 0.40–1.20)
GFR: 68.43 mL/min (ref >60.00)
GLUCOSE: 94 mg/dL (ref 70–99)
Potassium: 3.7 mEq/L (ref 3.5–5.1)
Sodium: 140 mEq/L (ref 135–145)
TOTAL PROTEIN: 7.1 g/dL (ref 6.0–8.3)

## 2017-10-02 LAB — LIPID PANEL
CHOLESTEROL: 160 mg/dL (ref 0–200)
HDL: 48.3 mg/dL (ref >39.00)
LDL Cholesterol: 76 mg/dL (ref 0–99)
NonHDL: 112.08
TRIGLYCERIDES: 178 mg/dL — AB (ref 0.0–149.0)
Total CHOL/HDL Ratio: 3
VLDL: 35.6 mg/dL (ref 0.0–40.0)

## 2017-10-02 LAB — HEMOGLOBIN A1C: HEMOGLOBIN A1C: 5.6 % (ref 4.6–6.5)

## 2017-10-02 LAB — TSH: TSH: 1.92 u[IU]/mL (ref 0.35–4.50)

## 2017-10-11 ENCOUNTER — Encounter: Payer: Self-pay | Admitting: Primary Care

## 2017-10-11 ENCOUNTER — Ambulatory Visit (INDEPENDENT_AMBULATORY_CARE_PROVIDER_SITE_OTHER): Payer: 59 | Admitting: Primary Care

## 2017-10-11 VITALS — BP 122/78 | HR 60 | Temp 98.4°F | Ht 61.0 in | Wt 183.0 lb

## 2017-10-11 DIAGNOSIS — Z0000 Encounter for general adult medical examination without abnormal findings: Secondary | ICD-10-CM

## 2017-10-11 DIAGNOSIS — M8580 Other specified disorders of bone density and structure, unspecified site: Secondary | ICD-10-CM | POA: Diagnosis not present

## 2017-10-11 DIAGNOSIS — E079 Disorder of thyroid, unspecified: Secondary | ICD-10-CM

## 2017-10-11 DIAGNOSIS — F3342 Major depressive disorder, recurrent, in full remission: Secondary | ICD-10-CM | POA: Diagnosis not present

## 2017-10-11 DIAGNOSIS — E785 Hyperlipidemia, unspecified: Secondary | ICD-10-CM | POA: Diagnosis not present

## 2017-10-11 DIAGNOSIS — E039 Hypothyroidism, unspecified: Secondary | ICD-10-CM | POA: Diagnosis not present

## 2017-10-11 DIAGNOSIS — E7800 Pure hypercholesterolemia, unspecified: Secondary | ICD-10-CM

## 2017-10-11 DIAGNOSIS — R7303 Prediabetes: Secondary | ICD-10-CM | POA: Diagnosis not present

## 2017-10-11 MED ORDER — CITALOPRAM HYDROBROMIDE 20 MG PO TABS: 20.0000 mg | ORAL_TABLET | Freq: Every day | ORAL | 3 refills | Status: DC

## 2017-10-11 MED ORDER — ATORVASTATIN CALCIUM 40 MG PO TABS: 40.0000 mg | ORAL_TABLET | Freq: Every evening | ORAL | 3 refills | Status: DC

## 2017-10-11 MED ORDER — LEVOTHYROXINE SODIUM 112 MCG PO TABS: ORAL_TABLET | ORAL | 3 refills | Status: DC

## 2017-10-11 NOTE — Progress Notes (Signed)
Subjective:    Patient ID: Bianca Lynch, female    DOB: November 07, 1955, 62 y.o.   MRN: [id number]  HPI  Bianca Lynch is a 62 year old female who presents today for complete physical.  Immunizations: -Tetanus: Completed in 2015 -Influenza: Completed this season  -Shingles: Completed Zostavax in 2014, Completed Shingrix in 2018  Diet: She endorses a fair diet. Breakfast: Carnation instant breakfast Lunch: Left overs from dinner Dinner: Meat, vegetable Snacks: None Desserts: Cookie, Reece's cup, daily Beverages: Coffee, water, wine  Exercise: She is not currently exercising Eye exam: Completed one year ago Dental exam: Completes semi-annually Colonoscopy: Completed in 2016, due in 2026 Dexa: Completed in 2017, due this year, follows with GYN. Pap Smear: Completed in 2018, follow with GYN. Mammogram: Completed in 2018, due this summer Hep C Screen: Negative in 2014   Review of Systems  Constitutional: Negative for unexpected weight change.  HENT: Negative for rhinorrhea.   Respiratory: Negative for cough and shortness of breath.   Cardiovascular: Negative for chest pain.  Gastrointestinal: Negative for constipation and diarrhea.  Genitourinary: Negative for difficulty urinating and menstrual problem.  Musculoskeletal: Negative for arthralgias and myalgias.  Skin: Negative for rash.  Allergic/Immunologic: Negative for environmental allergies.  Neurological: Negative for dizziness, numbness and headaches.  Psychiatric/Behavioral:       Doing well on Citalopram       Past Medical History:  Diagnosis Date  . Allergy    seasonal  . Chicken pox   . Depression   . Eczema   . Elevated cholesterol   . GERD (gastroesophageal reflux disease)   . Insomnia    patient denies this at PAT appt 02/09/15  . Meniere disease   . Menopausal symptom    HOT FLASHES  . Osteopenia   . Thyroid disease    Hypothyroid     Social History   Socioeconomic History  . Marital status:  Married    Spouse name: Not on file  . Number of children: Not on file  . Years of education: Not on file  . Highest education level: Not on file  Occupational History  . Not on file  Social Needs  . Financial resource strain: Not on file  . Food insecurity:    Worry: Not on file    Inability: Not on file  . Transportation needs:    Medical: Not on file    Non-medical: Not on file  Tobacco Use  . Smoking status: Former Smoker    Last attempt to quit: 11/09/1999    Years since quitting: 17.9  . Smokeless tobacco: Never Used  Substance and Sexual Activity  . Alcohol use: Yes    Alcohol/week: 8.4 oz    Types: 14 Standard drinks or equivalent per week    Comment: WINE WITH DINNER   . Drug use: No  . Sexual activity: Never    Birth control/protection: Post-menopausal  Lifestyle  . Physical activity:    Days per week: Not on file    Minutes per session: Not on file  . Stress: Not on file  Relationships  . Social connections:    Talks on phone: Not on file    Gets together: Not on file    Attends religious service: Not on file    Active member of club or organization: Not on file    Attends meetings of clubs or organizations: Not on file    Relationship status: Not on file  . Intimate partner violence:  Fear of current or ex partner: Not on file    Emotionally abused: Not on file    Physically abused: Not on file    Forced sexual activity: Not on file  Other Topics Concern  . Not on file  Social History Narrative   Married.   No children.   Works as an Optometrist.   Enjoys bowling, play computer games, crossword puzzles    Past Surgical History:  Procedure Laterality Date  . COLONOSCOPY    . TUBAL LIGATION  11/22/1998   [facility]/DR. GOTTSEGEN  . WISDOM TOOTH EXTRACTION      Family History  Problem Relation Age of Onset  . Hypertension Mother   . Heart disease Mother   . Cancer Mother        UTERINE  . Heart disease Father   . Breast cancer Paternal Aunt          age 62  . Colon cancer Neg Hx     No Known Allergies  Current Outpatient Medications on File Prior to Visit  Medication Sig Dispense Refill  . Calcium Carb-Cholecalciferol (CALCIUM 600 + D PO) Take by mouth.    . cetirizine (ZYRTEC) 10 MG tablet Take 10 mg by mouth daily.    . hydrOXYzine (ATARAX/VISTARIL) 10 MG tablet Take 1 tablet (10 mg total) by mouth 2 (two) times daily as needed. 30 tablet 0  . Multiple Vitamins-Minerals (CENTRUM SILVER PO) Take by mouth.    . RaNITidine HCl (ZANTAC PO) Take by mouth.     No current facility-administered medications on file prior to visit.     BP 122/78   Pulse 60   Temp 98.4 F (36.9 C) (Oral)   Ht 5\' 1"  (1.549 m)   Wt 183 lb (83 kg)   SpO2 98%   BMI 34.58 kg/m    Objective:   Physical Exam  Constitutional: She is oriented to person, place, and time. She appears well-nourished.  HENT:  Right Ear: Tympanic membrane and ear canal normal.  Left Ear: Tympanic membrane and ear canal normal.  Nose: Nose normal.  Mouth/Throat: Oropharynx is clear and moist.  Eyes: Pupils are equal, round, and reactive to light. Conjunctivae and EOM are normal.  Neck: Neck supple. No thyromegaly present.  Cardiovascular: Normal rate and regular rhythm.  No murmur heard. Pulmonary/Chest: Effort normal and breath sounds normal. She has no rales.  Abdominal: Soft. Bowel sounds are normal. There is no tenderness.  Musculoskeletal: Normal range of motion.  Lymphadenopathy:    She has no cervical adenopathy.  Neurological: She is alert and oriented to person, place, and time. She has normal reflexes. No cranial nerve deficit.  Skin: Skin is warm and dry. No rash noted.  Psychiatric: She has a normal mood and affect.          Assessment & Plan:

## 2017-10-11 NOTE — Assessment & Plan Note (Signed)
Recent lipid panel stable on atorvastatin, continue same. Denies myalgias.

## 2017-10-11 NOTE — Assessment & Plan Note (Signed)
Immunizations UTD. Mammogram due this Summer, follows with GYN. Pap smear UTD, follows with GYN. Colonoscopy UTD. Discussed the importance of a healthy diet and regular exercise in order for weight loss, and to reduce the risk of any potential medical problems. Exam unremarkable. Labs stable. Follow up in 1 year.

## 2017-10-11 NOTE — Assessment & Plan Note (Signed)
Compliant to calcium and vitamin D. Repeat Bone Density scan due, she gets this done through her GYN office.

## 2017-10-11 NOTE — Assessment & Plan Note (Signed)
Recent A1C of 5.6.  Fair diet, recommended regular exercise. Continue to monitor.

## 2017-10-11 NOTE — Patient Instructions (Signed)
Continue to work on [employer].  Start exercising. You should be getting 150 minutes of moderate intensity exercise weekly.  Ensure you are consuming 64 ounces of water daily.  Follow up with your gynecologist as scheduled.  It was a pleasure to see you today!   Preventive Care 40-64 Years, Female Preventive care refers to lifestyle choices and visits with your health care provider that can promote health and wellness. What does preventive care include?  A yearly physical exam. This is also called an annual well check.  Dental exams once or twice a year.  Routine eye exams. Ask your health care provider how often you should have your eyes checked.  Personal lifestyle choices, including: ? Daily care of your teeth and gums. ? Regular physical activity. ? Eating a healthy diet. ? Avoiding tobacco and drug use. ? Limiting alcohol use. ? Practicing safe sex. ? Taking low-dose aspirin daily starting at age 1. ? Taking vitamin and mineral supplements as recommended by your health care provider. What happens during an annual well check? The services and screenings done by your health care provider during your annual well check will depend on your age, overall health, lifestyle risk factors, and family history of disease. Counseling Your health care provider may ask you questions about your:  Alcohol use.  Tobacco use.  Drug use.  Emotional well-being.  Home and relationship well-being.  Sexual activity.  Eating habits.  Work and work Statistician.  Method of birth control.  Menstrual cycle.  Pregnancy history.  Screening You may have the following tests or measurements:  Height, weight, and BMI.  Blood pressure.  Lipid and cholesterol levels. These may be checked every 5 years, or more frequently if you are over 82 years old.  Skin check.  Lung cancer screening. You may have this screening every year starting at age 59 if you have a 30-pack-year history of  smoking and currently smoke or have quit within the past 15 years.  Fecal occult blood test (FOBT) of the stool. You may have this test every year starting at age 49.  Flexible sigmoidoscopy or colonoscopy. You may have a sigmoidoscopy every 5 years or a colonoscopy every 10 years starting at age 79.  Hepatitis C blood test.  Hepatitis B blood test.  Sexually transmitted disease (STD) testing.  Diabetes screening. This is done by checking your blood sugar (glucose) after you have not eaten for a while (fasting). You may have this done every 1-3 years.  Mammogram. This may be done every 1-2 years. Talk to your health care provider about when you should start having regular mammograms. This may depend on whether you have a family history of breast cancer.  BRCA-related cancer screening. This may be done if you have a family history of breast, ovarian, tubal, or peritoneal cancers.  Pelvic exam and Pap test. This may be done every 3 years starting at age 15. Starting at age 10, this may be done every 5 years if you have a Pap test in combination with an HPV test.  Bone density scan. This is done to screen for osteoporosis. You may have this scan if you are at high risk for osteoporosis.  Discuss your test results, treatment options, and if necessary, the need for more tests with your health care provider. Vaccines Your health care provider may recommend certain vaccines, such as:  Influenza vaccine. This is recommended every year.  Tetanus, diphtheria, and acellular pertussis (Tdap, Td) vaccine. You may need a  Td booster every 10 years.  Varicella vaccine. You may need this if you have not been vaccinated.  Zoster vaccine. You may need this after age 19.  Measles, mumps, and rubella (MMR) vaccine. You may need at least one dose of MMR if you were born in 1957 or later. You may also need a second dose.  Pneumococcal 13-valent conjugate (PCV13) vaccine. You may need this if you have  certain conditions and were not previously vaccinated.  Pneumococcal polysaccharide (PPSV23) vaccine. You may need one or two doses if you smoke cigarettes or if you have certain conditions.  Meningococcal vaccine. You may need this if you have certain conditions.  Hepatitis A vaccine. You may need this if you have certain conditions or if you travel or work in places where you may be exposed to hepatitis A.  Hepatitis B vaccine. You may need this if you have certain conditions or if you travel or work in places where you may be exposed to hepatitis B.  Haemophilus influenzae type b (Hib) vaccine. You may need this if you have certain conditions.  Talk to your health care provider about which screenings and vaccines you need and how often you need them. This information is not intended to replace advice given to you by your health care provider. Make sure you discuss any questions you have with your health care provider. Document Released: 07/23/2015 Document Revised: 03/15/2016 Document Reviewed: 04/27/2015 Elsevier Interactive Patient Education  [employer].

## 2017-10-11 NOTE — Assessment & Plan Note (Signed)
Doing well on Citalopram, continue same. Denies SI/HI.

## 2017-10-11 NOTE — Assessment & Plan Note (Signed)
Did follow with endocrinology last year who released her to a PRN basis. Recent TSH unremarkable. Continue levothyroxine 112 mcg.

## 2017-10-19 ENCOUNTER — Other Ambulatory Visit: Payer: Self-pay | Admitting: Primary Care

## 2017-10-19 DIAGNOSIS — E039 Hypothyroidism, unspecified: Secondary | ICD-10-CM

## 2017-11-30 ENCOUNTER — Ambulatory Visit (INDEPENDENT_AMBULATORY_CARE_PROVIDER_SITE_OTHER): Payer: 59 | Admitting: Obstetrics & Gynecology

## 2017-11-30 ENCOUNTER — Encounter: Payer: Self-pay | Admitting: Obstetrics & Gynecology

## 2017-11-30 VITALS — BP 130/72 | Ht 61.0 in | Wt 181.0 lb

## 2017-11-30 DIAGNOSIS — Z780 Asymptomatic menopausal state: Secondary | ICD-10-CM

## 2017-11-30 DIAGNOSIS — B977 Papillomavirus as the cause of diseases classified elsewhere: Secondary | ICD-10-CM

## 2017-11-30 DIAGNOSIS — Z1151 Encounter for screening for human papillomavirus (HPV): Secondary | ICD-10-CM | POA: Diagnosis not present

## 2017-11-30 DIAGNOSIS — E6609 Other obesity due to excess calories: Secondary | ICD-10-CM | POA: Diagnosis not present

## 2017-11-30 DIAGNOSIS — Z01419 Encounter for gynecological examination (general) (routine) without abnormal findings: Secondary | ICD-10-CM | POA: Diagnosis not present

## 2017-11-30 DIAGNOSIS — Z6834 Body mass index (BMI) 34.0-34.9, adult: Secondary | ICD-10-CM

## 2017-11-30 DIAGNOSIS — N72 Inflammatory disease of cervix uteri: Secondary | ICD-10-CM

## 2017-11-30 NOTE — Progress Notes (Signed)
Bianca Lynch 1956-06-23 [id number]   History:    62 y.o. G0 Married x 10 years  RP:  Established patient presenting for annual gyn exam   HPI: Menopause, well without HRT.  No PMB.  No pelvic pain.  No pain with IC.  Normal vaginal secretions.  Urine/BMs wnl.  Breasts wnl.  Not very active physically.  BMI 34.20.  Health labs with Fam MD.  Harriet Masson normal 2016.  Past medical history,surgical history, family history and social history were all reviewed and documented in the EPIC chart.  Gynecologic History No LMP recorded. Patient is postmenopausal. Contraception: post menopausal status Last Pap: 11/2016. Results were: Negative, HR HPV neg.  H/O HR HPV positive 11/2015, but HPV 16-18-45 neg and Pap negative. Last mammogram: 12/2016. Results were: Negative Bone Density: 01/2016 Normal, will repeat at 3 years Colonoscopy: 2016 normal, 10 year schedule  Obstetric History OB History  Gravida Para Term Preterm AB Living  0            SAB TAB Ectopic Multiple Live Births                ROS: A ROS was performed and pertinent positives and negatives are included in the history.  GENERAL: No fevers or chills. HEENT: No change in vision, no earache, sore throat or sinus congestion. NECK: No pain or stiffness. CARDIOVASCULAR: No chest pain or pressure. No palpitations. PULMONARY: No shortness of breath, cough or wheeze. GASTROINTESTINAL: No abdominal pain, nausea, vomiting or diarrhea, melena or bright red blood per rectum. GENITOURINARY: No urinary frequency, urgency, hesitancy or dysuria. MUSCULOSKELETAL: No joint or muscle pain, no back pain, no recent trauma. DERMATOLOGIC: No rash, no itching, no lesions. ENDOCRINE: No polyuria, polydipsia, no heat or cold intolerance. No recent change in weight. HEMATOLOGICAL: No anemia or easy bruising or bleeding. NEUROLOGIC: No headache, seizures, numbness, tingling or weakness. PSYCHIATRIC: No depression, no loss of interest in normal activity or change in  sleep pattern.     Exam:   BP 130/72   Ht 5\' 1"  (1.549 m)   Wt 181 lb (82.1 kg)   BMI 34.20 kg/m   Body mass index is 34.2 kg/m.  General appearance : Well developed well nourished female. No acute distress HEENT: Eyes: no retinal hemorrhage or exudates,  Neck supple, trachea midline, no carotid bruits, no thyroidmegaly Lungs: Clear to auscultation, no rhonchi or wheezes, or rib retractions  Heart: Regular rate and rhythm, no murmurs or gallops Breast:Examined in sitting and supine position were symmetrical in appearance, no palpable masses or tenderness,  no skin retraction, no nipple inversion, no nipple discharge, no skin discoloration, no axillary or supraclavicular lymphadenopathy Abdomen: no palpable masses or tenderness, no rebound or guarding Extremities: no edema or skin discoloration or tenderness  Pelvic: Vulva: Normal             Vagina: No gross lesions or discharge  Cervix: No gross lesions or discharge.  Pap/HPV HR done.  Uterus  AV, normal size, shape and consistency, non-tender and mobile  Adnexa  Without masses or tenderness  Anus: Normal   Assessment/Plan:  62 y.o. female for annual exam   1. Encounter for routine gynecological examination with Papanicolaou smear of cervix Normal gynecologic exam.  Pap with high-risk HPV done.  Breast exam normal.  Will repeat screening mammogram in June 2019.  Health labs with family physician.  Colonoscopy negative in 2016.  2. Menopause present Well on no hormone replacement therapy.  No postmenopausal  bleeding.  Bone density normal in July 2017.  Will repeat at 3 years.  Vitamin D supplements, calcium rich nutrition and regular weightbearing physical activity recommended.  3. High risk human papilloma virus (HPV) infection of cervix Pap with HR HPV done today.  4. Class 1 obesity due to excess calories without serious comorbidity with body mass index (BMI) of 34.0 to 34.9 in adult Recommend low calorie/low carb diet  such as [employer] and regular physical activity with aerobic activities 5 times a week and weightlifting every 2 days.  Princess Bruins MD, 10:13 AM 11/30/2017

## 2017-12-01 ENCOUNTER — Encounter: Payer: Self-pay | Admitting: Obstetrics & Gynecology

## 2017-12-01 NOTE — Patient Instructions (Signed)
1. Encounter for routine gynecological examination with Papanicolaou smear of cervix Normal gynecologic exam.  Pap with high-risk HPV done.  Breast exam normal.  Will repeat screening mammogram in June 2019.  Health labs with family physician.  Colonoscopy negative in 2016.  2. Menopause present Well on no hormone replacement therapy.  No postmenopausal bleeding.  Bone density normal in July 2017.  Will repeat at 3 years.  Vitamin D supplements, calcium rich nutrition and regular weightbearing physical activity recommended.  3. High risk human papilloma virus (HPV) infection of cervix Pap with HR HPV done today.  4. Class 1 obesity due to excess calories without serious comorbidity with body mass index (BMI) of 34.0 to 34.9 in adult Recommend low calorie/low carb diet such as [employer] and regular physical activity with aerobic activities 5 times a week and weightlifting every 2 days.  Bianca Lynch, it was a pleasure meeting you today!  I will inform you of your results as soon as they are available.

## 2017-12-04 LAB — PAP, TP IMAGING W/ HPV RNA, RFLX HPV TYPE 16,18/45: HPV DNA High Risk: NOT DETECTED

## 2017-12-07 ENCOUNTER — Other Ambulatory Visit: Payer: Self-pay | Admitting: Obstetrics & Gynecology

## 2017-12-07 DIAGNOSIS — Z1231 Encounter for screening mammogram for malignant neoplasm of breast: Secondary | ICD-10-CM

## 2017-12-26 ENCOUNTER — Ambulatory Visit
Admission: RE | Admit: 2017-12-26 | Discharge: 2017-12-26 | Disposition: A | Discharge status: Home / Self Care | Payer: 59 | Source: Ambulatory Visit | Attending: Obstetrics & Gynecology | Admitting: Obstetrics & Gynecology

## 2017-12-26 DIAGNOSIS — Z1231 Encounter for screening mammogram for malignant neoplasm of breast: Secondary | ICD-10-CM

## 2017-12-28 DIAGNOSIS — H2513 Age-related nuclear cataract, bilateral: Secondary | ICD-10-CM | POA: Diagnosis not present

## 2018-04-06 DIAGNOSIS — Z23 Encounter for immunization: Secondary | ICD-10-CM | POA: Diagnosis not present

## 2018-08-23 ENCOUNTER — Other Ambulatory Visit: Payer: Self-pay | Admitting: Primary Care

## 2018-08-23 DIAGNOSIS — E039 Hypothyroidism, unspecified: Secondary | ICD-10-CM

## 2018-09-16 ENCOUNTER — Other Ambulatory Visit: Payer: Self-pay | Admitting: Primary Care

## 2018-09-16 DIAGNOSIS — E785 Hyperlipidemia, unspecified: Secondary | ICD-10-CM

## 2018-10-04 ENCOUNTER — Other Ambulatory Visit: Payer: Self-pay | Admitting: Primary Care

## 2018-10-04 ENCOUNTER — Telehealth: Payer: Self-pay | Admitting: Primary Care

## 2018-10-04 DIAGNOSIS — F3342 Major depressive disorder, recurrent, in full remission: Secondary | ICD-10-CM

## 2018-10-04 NOTE — Telephone Encounter (Signed)
lvm asking pt to call office. Need to r/s cpe lab and cpe.

## 2018-10-10 ENCOUNTER — Other Ambulatory Visit: Payer: 59

## 2018-10-15 ENCOUNTER — Encounter: Payer: 59 | Admitting: Primary Care

## 2018-11-08 ENCOUNTER — Telehealth: Payer: Self-pay | Admitting: Primary Care

## 2018-11-08 NOTE — Telephone Encounter (Signed)
Patient is due for follow up of chronic medical conditions. Please schedule virtual visit.

## 2018-11-17 ENCOUNTER — Other Ambulatory Visit: Payer: Self-pay | Admitting: Primary Care

## 2018-11-17 DIAGNOSIS — E039 Hypothyroidism, unspecified: Secondary | ICD-10-CM

## 2018-12-04 ENCOUNTER — Other Ambulatory Visit: Payer: Self-pay

## 2018-12-05 ENCOUNTER — Encounter: Payer: Self-pay | Admitting: Obstetrics & Gynecology

## 2018-12-05 ENCOUNTER — Ambulatory Visit (INDEPENDENT_AMBULATORY_CARE_PROVIDER_SITE_OTHER): Payer: 59 | Admitting: Obstetrics & Gynecology

## 2018-12-05 VITALS — BP 160/88 | Ht 61.0 in | Wt 180.0 lb

## 2018-12-05 DIAGNOSIS — Z01419 Encounter for gynecological examination (general) (routine) without abnormal findings: Secondary | ICD-10-CM | POA: Diagnosis not present

## 2018-12-05 DIAGNOSIS — R87810 Cervical high risk human papillomavirus (HPV) DNA test positive: Secondary | ICD-10-CM | POA: Diagnosis not present

## 2018-12-05 DIAGNOSIS — Z780 Asymptomatic menopausal state: Secondary | ICD-10-CM

## 2018-12-05 DIAGNOSIS — Z6834 Body mass index (BMI) 34.0-34.9, adult: Secondary | ICD-10-CM

## 2018-12-05 DIAGNOSIS — E6609 Other obesity due to excess calories: Secondary | ICD-10-CM

## 2018-12-05 NOTE — Patient Instructions (Signed)
1. Encounter for routine gynecological examination with Papanicolaou smear of cervix Normal gynecologic exam in menopause.  History of high risk HPV in 2017.  Last Pap test May 2019 was negative with negative high-risk HPV, but transition zone cells were absent.  Pap reflex done this year.  Breast exam normal.  Last mammogram June 2019 was negative.  Colonoscopy in 2016.  Health labs with nurse practitioner.  2. Cervical high risk HPV (human papillomavirus) test positive HPV high-risk positive in 2017.  3. Postmenopause Well on no hormone replacement therapy.  No postmenopausal bleeding.  Bone density normal in July 2017.  Will repeat July 2020 here.  Vitamin D supplements, calcium intake of 1200 to 1500 mg daily and regular weightbearing physical activity is recommended.  4. Class 1 obesity due to excess calories without serious comorbidity with body mass index (BMI) of 34.0 to 34.9 in adult Recommend a lower calorie/carb diet such as [employer].  Increase physical activities with aerobic activities 5 times a week and weightlifting every 2 days.  Fancy, it was a pleasure seeing you today!  I will inform you of your results as soon as they are available.

## 2018-12-05 NOTE — Progress Notes (Signed)
Bianca Lynch 1956/07/10 [id number]   History:    63 y.o. G0 Married  RP:  Established patient presenting for annual gyn exam   HPI: Postmenopause, well on no HRT.  No PMB.  No pelvic pain.  Abstinent.  Urine/BMs normal.  Breasts normal.  BMI 34.01.  Not exercising regularly.  Health Labs with NP.    Past medical history,surgical history, family history and social history were all reviewed and documented in the EPIC chart.  Gynecologic History No LMP recorded. Patient is postmenopausal. Contraception: post menopausal status Last Pap: 11/2017. Results were: Negative, HPV HR neg, but TZ cells absent.  H/O HPV HR pos 2017. Last mammogram: 12/2017. Results were: Negative Bone Density: 01/2016 Normal.  Repeat BD here 01/2019. Colonoscopy: 2016  Obstetric History OB History  Gravida Para Term Preterm AB Living  0            SAB TAB Ectopic Multiple Live Births                ROS: A ROS was performed and pertinent positives and negatives are included in the history.  GENERAL: No fevers or chills. HEENT: No change in vision, no earache, sore throat or sinus congestion. NECK: No pain or stiffness. CARDIOVASCULAR: No chest pain or pressure. No palpitations. PULMONARY: No shortness of breath, cough or wheeze. GASTROINTESTINAL: No abdominal pain, nausea, vomiting or diarrhea, melena or bright red blood per rectum. GENITOURINARY: No urinary frequency, urgency, hesitancy or dysuria. MUSCULOSKELETAL: No joint or muscle pain, no back pain, no recent trauma. DERMATOLOGIC: No rash, no itching, no lesions. ENDOCRINE: No polyuria, polydipsia, no heat or cold intolerance. No recent change in weight. HEMATOLOGICAL: No anemia or easy bruising or bleeding. NEUROLOGIC: No headache, seizures, numbness, tingling or weakness. PSYCHIATRIC: No depression, no loss of interest in normal activity or change in sleep pattern.     Exam:   BP (!) 160/88   Ht 5\' 1"  (1.549 m)   Wt 180 lb (81.6 kg)   BMI 34.01 kg/m    Body mass index is 34.01 kg/m.  General appearance : Well developed well nourished female. No acute distress HEENT: Eyes: no retinal hemorrhage or exudates,  Neck supple, trachea midline, no carotid bruits, no thyroidmegaly Lungs: Clear to auscultation, no rhonchi or wheezes, or rib retractions  Heart: Regular rate and rhythm, no murmurs or gallops Breast:Examined in sitting and supine position were symmetrical in appearance, no palpable masses or tenderness,  no skin retraction, no nipple inversion, no nipple discharge, no skin discoloration, no axillary or supraclavicular lymphadenopathy Abdomen: no palpable masses or tenderness, no rebound or guarding Extremities: no edema or skin discoloration or tenderness  Pelvic: Vulva: Normal             Vagina: No gross lesions or discharge  Cervix: No gross lesions or discharge.  Pap reflex done.  Uterus  AV, normal size, shape and consistency, non-tender and mobile  Adnexa  Without masses or tenderness  Anus: Normal   Assessment/Plan:  63 y.o. female for annual exam   1. Encounter for routine gynecological examination with Papanicolaou smear of cervix Normal gynecologic exam in menopause.  History of high risk HPV in 2017.  Last Pap test May 2019 was negative with negative high-risk HPV, but transition zone cells were absent.  Pap reflex done this year.  Breast exam normal.  Last mammogram June 2019 was negative.  Colonoscopy in 2016.  Health labs with nurse practitioner.  2. Cervical high risk HPV (  human papillomavirus) test positive HPV high-risk positive in 2017.  3. Postmenopause Well on no hormone replacement therapy.  No postmenopausal bleeding.  Bone density normal in July 2017.  Will repeat July 2020 here.  Vitamin D supplements, calcium intake of 1200 to 1500 mg daily and regular weightbearing physical activity is recommended.  4. Class 1 obesity due to excess calories without serious comorbidity with body mass index (BMI) of 34.0  to 34.9 in adult Recommend a lower calorie/carb diet such as [employer].  Increase physical activities with aerobic activities 5 times a week and weightlifting every 2 days.  Princess Bruins MD, 10:49 AM 12/05/2018

## 2018-12-09 LAB — PAP IG W/ RFLX HPV ASCU

## 2018-12-10 ENCOUNTER — Other Ambulatory Visit: Payer: Self-pay | Admitting: Primary Care

## 2018-12-10 DIAGNOSIS — E785 Hyperlipidemia, unspecified: Secondary | ICD-10-CM

## 2018-12-11 ENCOUNTER — Other Ambulatory Visit: Payer: Self-pay | Admitting: Primary Care

## 2018-12-11 DIAGNOSIS — R7303 Prediabetes: Secondary | ICD-10-CM

## 2018-12-11 DIAGNOSIS — E7800 Pure hypercholesterolemia, unspecified: Secondary | ICD-10-CM

## 2018-12-11 DIAGNOSIS — E079 Disorder of thyroid, unspecified: Secondary | ICD-10-CM

## 2018-12-16 ENCOUNTER — Other Ambulatory Visit (INDEPENDENT_AMBULATORY_CARE_PROVIDER_SITE_OTHER): Payer: 59

## 2018-12-16 ENCOUNTER — Other Ambulatory Visit: Payer: Self-pay

## 2018-12-16 DIAGNOSIS — E7800 Pure hypercholesterolemia, unspecified: Secondary | ICD-10-CM

## 2018-12-16 DIAGNOSIS — R7303 Prediabetes: Secondary | ICD-10-CM

## 2018-12-16 DIAGNOSIS — E079 Disorder of thyroid, unspecified: Secondary | ICD-10-CM | POA: Diagnosis not present

## 2018-12-16 LAB — LIPID PANEL
Cholesterol: 183 mg/dL (ref 0–200)
HDL: 48.8 mg/dL (ref >39.00)
LDL Cholesterol: 99 mg/dL (ref 0–99)
NonHDL: 134.53
Total CHOL/HDL Ratio: 4
Triglycerides: 176 mg/dL — ABNORMAL HIGH (ref 0.0–149.0)
VLDL: 35.2 mg/dL (ref 0.0–40.0)

## 2018-12-16 LAB — COMPREHENSIVE METABOLIC PANEL
ALT: 17 U/L (ref 0–35)
AST: 16 U/L (ref 0–37)
Albumin: 4.2 g/dL (ref 3.5–5.2)
Alkaline Phosphatase: 55 U/L (ref 39–117)
BUN: 13 mg/dL (ref 6–23)
CO2: 28 mEq/L (ref 19–32)
Calcium: 9.4 mg/dL (ref 8.4–10.5)
Chloride: 104 mEq/L (ref 96–112)
Creatinine, Ser: 0.87 mg/dL (ref 0.40–1.20)
GFR: 65.83 mL/min (ref >60.00)
Glucose, Bld: 92 mg/dL (ref 70–99)
Potassium: 4.5 mEq/L (ref 3.5–5.1)
Sodium: 140 mEq/L (ref 135–145)
Total Bilirubin: 0.5 mg/dL (ref 0.2–1.2)
Total Protein: 6.5 g/dL (ref 6.0–8.3)

## 2018-12-16 LAB — HEMOGLOBIN A1C: Hgb A1c MFr Bld: 5.6 % (ref 4.6–6.5)

## 2018-12-16 LAB — TSH: TSH: 0.6 u[IU]/mL (ref 0.35–4.50)

## 2018-12-23 ENCOUNTER — Ambulatory Visit (INDEPENDENT_AMBULATORY_CARE_PROVIDER_SITE_OTHER): Payer: 59 | Admitting: Primary Care

## 2018-12-23 ENCOUNTER — Encounter: Payer: Self-pay | Admitting: Primary Care

## 2018-12-23 DIAGNOSIS — E785 Hyperlipidemia, unspecified: Secondary | ICD-10-CM | POA: Diagnosis not present

## 2018-12-23 DIAGNOSIS — E039 Hypothyroidism, unspecified: Secondary | ICD-10-CM

## 2018-12-23 DIAGNOSIS — E079 Disorder of thyroid, unspecified: Secondary | ICD-10-CM | POA: Diagnosis not present

## 2018-12-23 DIAGNOSIS — M8589 Other specified disorders of bone density and structure, multiple sites: Secondary | ICD-10-CM

## 2018-12-23 DIAGNOSIS — F3342 Major depressive disorder, recurrent, in full remission: Secondary | ICD-10-CM

## 2018-12-23 DIAGNOSIS — E7800 Pure hypercholesterolemia, unspecified: Secondary | ICD-10-CM

## 2018-12-23 DIAGNOSIS — R7303 Prediabetes: Secondary | ICD-10-CM

## 2018-12-23 DIAGNOSIS — R0683 Snoring: Secondary | ICD-10-CM | POA: Insufficient documentation

## 2018-12-23 MED ORDER — LEVOTHYROXINE SODIUM 112 MCG PO TABS: ORAL_TABLET | ORAL | 3 refills | Status: DC

## 2018-12-23 MED ORDER — CITALOPRAM HYDROBROMIDE 20 MG PO TABS: 20.0000 mg | ORAL_TABLET | Freq: Every day | ORAL | 3 refills | Status: DC

## 2018-12-23 MED ORDER — ATORVASTATIN CALCIUM 40 MG PO TABS: 40.0000 mg | ORAL_TABLET | Freq: Every evening | ORAL | 3 refills | Status: DC

## 2018-12-23 NOTE — Assessment & Plan Note (Signed)
Compliant to calcium and vitamin D. Continue same.

## 2018-12-23 NOTE — Assessment & Plan Note (Signed)
Recent lipid panel stable on atorvastatin 40 mg. Continue same.

## 2018-12-23 NOTE — Assessment & Plan Note (Signed)
Chronic for years, will wake herself up from sleep. Given recommendations from recent dentist visit along with recent home reading, will send for sleep evaluation. Referral placed to pulmonology.

## 2018-12-23 NOTE — Assessment & Plan Note (Signed)
Recent A1C without evidence of prediabetes. Continue to monitor.

## 2018-12-23 NOTE — Assessment & Plan Note (Signed)
Doing well on Celexa, continue same. Denies SI/HI.

## 2018-12-23 NOTE — Progress Notes (Signed)
Subjective:    Patient ID: Bianca Lynch, female    DOB: June 02, 1956, 63 y.o.   MRN: [id number]  HPI  Virtual Visit via Video Note  I connected with Bianca Lynch on 12/23/18 at  2:20 PM EDT by a video enabled telemedicine application and verified that I am speaking with the correct person using two identifiers.  Location: Patient: Home Provider: Office   I discussed the limitations of evaluation and management by telemedicine and the availability of in person appointments. The patient expressed understanding and agreed to proceed.  History of Present Illness:  Bianca Lynch is a 62 year old female who presents today for follow up of chronic medical conditions.  BP Readings from Last 3 Encounters:  12/05/18 (!) 160/88  11/30/17 130/72  10/11/17 122/78   She does not check her blood pressure at home. She denies dizziness, chest pain, shortness of breath.   She was recently evaluated by her dentist who stated that she has an obstructive airway. She took home a three night sleep monitor which showed that she had periods of apnea. It was recommended she be evaluated for sleep study.  Immunizations: -Tetanus: Completed in 2015 -Influenza: Completed last season  -Shingles: Completed Shingrix  Colonoscopy: Completed in 2016 Pap Smear: Completed in 2020 Mammogram: Completed in June 2019, scheduled for later 2020 Dexa: Completed in 2017 Hep C Screen: Negative    Observations/Objective:  Alert and oriented. Appears well, not sickly. No distress. Speaking in complete sentences.   Assessment and Plan:  See problem based charting.  Follow Up Instructions:  Call the main line to schedule a nurse visit for a blood pressure check.  It's important to improve your diet by reducing consumption of fast food, fried food, processed snack foods, sugary drinks. Increase consumption of fresh vegetables and fruits, whole grains, water.  Ensure you are drinking 64 ounces of water daily.   Start exercising. You should be getting 150 minutes of moderate intensity exercise weekly.  You will be contacted regarding your referral to pulmonology for the sleep study.  Please let us know if you have not been contacted within one week.   I sent refills of your medications to your pharmacy.  It was a pleasure to see you today! Allie Bossier, NP-C    I discussed the assessment and treatment plan with the patient. The patient was provided an opportunity to ask questions and all were answered. The patient agreed with the plan and demonstrated an understanding of the instructions.   The patient was advised to call back or seek an in-person evaluation if the symptoms worsen or if the condition fails to improve as anticipated.     Pleas Koch, NP    Review of Systems  Constitutional: Negative for unexpected weight change.  HENT: Negative for rhinorrhea.   Respiratory: Negative for cough and shortness of breath.   Cardiovascular: Negative for chest pain.  Gastrointestinal: Negative for constipation and diarrhea.  Genitourinary: Negative for difficulty urinating.  Musculoskeletal: Negative for arthralgias.  Skin: Negative for rash.  Allergic/Immunologic: Negative for environmental allergies.  Neurological: Negative for numbness and headaches.  Psychiatric/Behavioral:       Feels well managed on Celexa       Past Medical History:  Diagnosis Date  . Allergy    seasonal  . Chicken pox   . Depression   . Eczema   . Elevated cholesterol   . GERD (gastroesophageal reflux disease)   . Insomnia  patient denies this at PAT appt 02/09/15  . Meniere disease   . Menopausal symptom    HOT FLASHES  . Osteopenia   . Thyroid disease    Hypothyroid     Social History   Socioeconomic History  . Marital status: Married    Spouse name: Not on file  . Number of children: Not on file  . Years of education: Not on file  . Highest education level: Not on file  Occupational  History  . Not on file  Social Needs  . Financial resource strain: Not on file  . Food insecurity    Worry: Not on file    Inability: Not on file  . Transportation needs    Medical: Not on file    Non-medical: Not on file  Tobacco Use  . Smoking status: Former Smoker    Quit date: 11/09/1999    Years since quitting: 19.1  . Smokeless tobacco: Never Used  Substance and Sexual Activity  . Alcohol use: Yes    Alcohol/week: 14.0 standard drinks    Types: 14 Standard drinks or equivalent per week    Comment: 2 GLASSES OF WINE WITH DINNER   . Drug use: No  . Sexual activity: Not Currently    Birth control/protection: Post-menopausal    Comment: 1st intercourse-17, partners- 10, married- 10 yrs   Lifestyle  . Physical activity    Days per week: Not on file    Minutes per session: Not on file  . Stress: Not on file  Relationships  . Social Herbalist on phone: Not on file    Gets together: Not on file    Attends religious service: Not on file    Active member of club or organization: Not on file    Attends meetings of clubs or organizations: Not on file    Relationship status: Not on file  . Intimate partner violence    Fear of current or ex partner: Not on file    Emotionally abused: Not on file    Physically abused: Not on file    Forced sexual activity: Not on file  Other Topics Concern  . Not on file  Social History Narrative   Married.   No children.   Works as an Optometrist.   Enjoys bowling, play computer games, crossword puzzles    Past Surgical History:  Procedure Laterality Date  . COLONOSCOPY    . TUBAL LIGATION  11/22/1998   [facility]/DR. [facility]  . WISDOM TOOTH EXTRACTION      Family History  Problem Relation Age of Onset  . Hypertension Mother   . Heart disease Mother   . Cancer Mother        UTERINE  . Heart disease Father   . Breast cancer Paternal Aunt        age 51  . Colon cancer Neg Hx     No Known Allergies  Current  Outpatient Medications on File Prior to Visit  Medication Sig Dispense Refill  . Calcium Carb-Cholecalciferol (CALCIUM 600 + D PO) Take by mouth.    . cetirizine (ZYRTEC) 10 MG tablet Take 10 mg by mouth daily.    . hydrOXYzine (ATARAX/VISTARIL) 10 MG tablet Take 1 tablet (10 mg total) by mouth 2 (two) times daily as needed. 30 tablet 0  . Multiple Vitamins-Minerals (CENTRUM SILVER PO) Take by mouth.    . RaNITidine HCl (ZANTAC PO) Take by mouth.    . vitamin B-12 (CYANOCOBALAMIN)  250 MCG tablet Take 250 mcg by mouth daily.     No current facility-administered medications on file prior to visit.     Ht 5\' 1"  (1.549 m)   Wt 180 lb (81.6 kg)   BMI 34.01 kg/m    Objective:   Physical Exam  Constitutional: She is oriented to person, place, and time. She appears well-nourished.  Neck: Neck supple.  Respiratory: Effort normal. No respiratory distress.  Musculoskeletal: Normal range of motion.  Neurological: She is alert and oriented to person, place, and time.  Psychiatric: She has a normal mood and affect.           Assessment & Plan:

## 2018-12-23 NOTE — Patient Instructions (Signed)
Call the main line to schedule a nurse visit for a blood pressure check.  It's important to improve your diet by reducing consumption of fast food, fried food, processed snack foods, sugary drinks. Increase consumption of fresh vegetables and fruits, whole grains, water.  Ensure you are drinking 64 ounces of water daily.  Start exercising. You should be getting 150 minutes of moderate intensity exercise weekly.  You will be contacted regarding your referral to pulmonology for the sleep study.  Please let us know if you have not been contacted within one week.   I sent refills of your medications to your pharmacy.  It was a pleasure to see you today! Allie Bossier, NP-C

## 2018-12-23 NOTE — Assessment & Plan Note (Signed)
Taking levothyroxine appropriately. Recent TSH stable. Refills provided for levothyroxine 112 mcg.

## 2019-01-13 ENCOUNTER — Other Ambulatory Visit: Payer: Self-pay

## 2019-01-13 ENCOUNTER — Ambulatory Visit: Payer: 59 | Admitting: Primary Care

## 2019-01-13 ENCOUNTER — Encounter: Payer: Self-pay | Admitting: Primary Care

## 2019-01-13 VITALS — BP 144/70 | HR 68 | Temp 97.2°F | Ht 61.0 in | Wt 180.0 lb

## 2019-01-13 DIAGNOSIS — R0683 Snoring: Secondary | ICD-10-CM

## 2019-01-13 DIAGNOSIS — I10 Essential (primary) hypertension: Secondary | ICD-10-CM | POA: Diagnosis not present

## 2019-01-13 MED ORDER — LISINOPRIL 10 MG PO TABS: 10.0000 mg | ORAL_TABLET | Freq: Every day | ORAL | 0 refills | Status: DC

## 2019-01-13 NOTE — Assessment & Plan Note (Signed)
Above goal on two prior readings, home readings are in the 140's/70's. Given elevated readings with history of hyperlipidemia we will treat with medication. Rx for lisinopril 10 mg sent to pharmacy.  We will also need to monitor BP closely if sleep apnea is diagnosed and treated. Encouraged lifestyle changes.   Follow up in 2 weeks for BP check and BMP.

## 2019-01-13 NOTE — Progress Notes (Signed)
Subjective:    Patient ID: Bianca Lynch, female    DOB: [birth year], 63 y.o.   MRN: [id number]  HPI  [name] is a 63 year old female who presents today for evaluation of elevated blood pressure reading.  She was last evaluated virtually on 12/23/18 and noted to have an elevated blood pressure reading. She was asked to monitor her readings and follow up today.   BP Readings from Last 3 Encounters:  01/13/19 (!) 144/70  12/05/18 (!) 160/88  11/30/17 130/72   She's been checking her BP at home and is getting readings of 140's/70's. She has an appointment with pulmonology later this week for evaluation of potential sleep apnea. She is not exercising much.  Wt Readings from Last 3 Encounters:  01/13/19 180 lb (81.6 kg)  12/23/18 180 lb (81.6 kg)  12/05/18 180 lb (81.6 kg)      Review of Systems  Eyes: Negative for visual disturbance.  Respiratory: Negative for shortness of breath.   Cardiovascular: Negative for chest pain.  Neurological: Negative for headaches.       Some dizziness with abrupt positional changes       Past Medical History:  Diagnosis Date  . Allergy    seasonal  . Chicken pox   . Depression   . Eczema   . Elevated cholesterol   . GERD (gastroesophageal reflux disease)   . Insomnia    patient denies this at PAT appt 02/09/15  . Meniere disease   . Menopausal symptom    HOT FLASHES  . Osteopenia   . Thyroid disease    Hypothyroid     Social History   Socioeconomic History  . Marital status: Married    Spouse name: Not on file  . Number of children: Not on file  . Years of education: Not on file  . Highest education level: Not on file  Occupational History  . Not on file  Social Needs  . Financial resource strain: Not on file  . Food insecurity    Worry: Not on file    Inability: Not on file  . Transportation needs    Medical: Not on file    Non-medical: Not on file  Tobacco Use  . Smoking status: Former Smoker    Quit date: 11/09/1999     Years since quitting: 19.1  . Smokeless tobacco: Never Used  Substance and Sexual Activity  . Alcohol use: Yes    Alcohol/week: 14.0 standard drinks    Types: 14 Standard drinks or equivalent per week    Comment: 2 GLASSES OF WINE WITH DINNER   . Drug use: No  . Sexual activity: Not Currently    Birth control/protection: Post-menopausal    Comment: 1st intercourse-17, partners- 8, married- 10 yrs   Lifestyle  . Physical activity    Days per week: Not on file    Minutes per session: Not on file  . Stress: Not on file  Relationships  . Social Herbalist on phone: Not on file    Gets together: Not on file    Attends religious service: Not on file    Active member of club or organization: Not on file    Attends meetings of clubs or organizations: Not on file    Relationship status: Not on file  . Intimate partner violence    Fear of current or ex partner: Not on file    Emotionally abused: Not on file    Physically  abused: Not on file    Forced sexual activity: Not on file  Other Topics Concern  . Not on file  Social History Narrative   Married.   No children.   Works as an Optometrist.   Enjoys bowling, play computer games, crossword puzzles    Past Surgical History:  Procedure Laterality Date  . COLONOSCOPY    . TUBAL LIGATION  11/22/1998   [facility]/DR. [facility]  . WISDOM TOOTH EXTRACTION      Family History  Problem Relation Age of Onset  . Hypertension Mother   . Heart disease Mother   . Cancer Mother        UTERINE  . Heart disease Father   . Breast cancer Paternal Aunt        age 32  . Colon cancer Neg Hx     No Known Allergies  Current Outpatient Medications on File Prior to Visit  Medication Sig Dispense Refill  . atorvastatin (LIPITOR) 40 MG tablet Take 1 tablet (40 mg total) by mouth every evening. For cholesterol. 90 tablet 3  . Calcium Carb-Cholecalciferol (CALCIUM 600 + D PO) Take by mouth.    . cetirizine (ZYRTEC) 10 MG tablet Take  10 mg by mouth daily.    . citalopram (CELEXA) 20 MG tablet Take 1 tablet (20 mg total) by mouth daily. 90 tablet 3  . hydrOXYzine (ATARAX/VISTARIL) 10 MG tablet Take 1 tablet (10 mg total) by mouth 2 (two) times daily as needed. 30 tablet 0  . levothyroxine (SYNTHROID) 112 MCG tablet TAKE 1 TABLET BY MOUTH  EVERY MORNING ON AN EMPTY  STOMACH WITH A FULL GLASS  OF WATER. NO OTHER MEDS OR  FOOD FOR 30 MINUTES. 90 tablet 3  . Multiple Vitamins-Minerals (CENTRUM SILVER PO) Take by mouth.    . vitamin B-12 (CYANOCOBALAMIN) 250 MCG tablet Take 250 mcg by mouth daily.     No current facility-administered medications on file prior to visit.     BP (!) 144/70   Pulse 68   Temp (!) 97.2 F (36.2 C) (Temporal)   Ht 5\' 1"  (1.549 m)   Wt 180 lb (81.6 kg)   SpO2 98%   BMI 34.01 kg/m    Objective:   Physical Exam  Constitutional: She appears well-nourished.  Neck: Neck supple.  Cardiovascular: Normal rate and regular rhythm.  Respiratory: Effort normal and breath sounds normal.  Skin: Skin is warm and dry.  Psychiatric: She has a normal mood and affect.           Assessment & Plan:

## 2019-01-13 NOTE — Assessment & Plan Note (Signed)
Appointment with pulmonology scheduled for this week.

## 2019-01-13 NOTE — Patient Instructions (Addendum)
Start lisinopril 10 mg tablets for high blood pressure. Take 1 tablet once daily.  Start monitoring your blood pressure daily, around the same time of day, for the next 2 weeks.  Ensure that you have rested for 30 minutes prior to checking your blood pressure. Record your readings and bring them to your next visit.  Start exercising. You should be getting 150 minutes of moderate intensity exercise weekly.  It's important to improve your diet by reducing consumption of fast food, fried food, processed snack foods, sugary drinks. Increase consumption of fresh vegetables and fruits, whole grains, water.  Ensure you are drinking 64 ounces of water daily.  Schedule a follow up visit for 2 weeks for blood pressure check.  It was a pleasure to see you today!

## 2019-01-15 ENCOUNTER — Encounter: Payer: Self-pay | Admitting: Pulmonary Disease

## 2019-01-15 ENCOUNTER — Ambulatory Visit: Payer: 59 | Admitting: Pulmonary Disease

## 2019-01-15 ENCOUNTER — Other Ambulatory Visit: Payer: Self-pay

## 2019-01-15 VITALS — BP 124/76 | HR 66 | Temp 98.1°F | Ht 60.5 in | Wt 179.2 lb

## 2019-01-15 DIAGNOSIS — Z87898 Personal history of other specified conditions: Secondary | ICD-10-CM

## 2019-01-15 NOTE — Addendum Note (Signed)
Addended by: Joella Prince on: 01/15/2019 04:27 PM   Modules accepted: Orders

## 2019-01-15 NOTE — Patient Instructions (Signed)
History of snoring Recent overnight study suggestive of possible mild sleep disordered breathing  We will schedule home sleep study  We will update you with results  Treatment options for sleep disordered breathing as discussed  Encouraged continued regular exercise to try and keep your weight in check  I will see you back in the office in about 2 to 3 months Call with significant concerns

## 2019-01-15 NOTE — Progress Notes (Signed)
  Subjective:     Patient ID: Bianca Lynch, female   DOB: [birth year], 63 y.o.   MRN: [id number]  Patient with a history of snoring  Was recently evaluated by a dentist with a sleep device Noted mild sleep disordered breathing  History of TMJ for which an oral device was fashioned She was also told that she has a crowded airway  Has been told about snoring Denies history of witnessed apneas Usually goes to bed between 11 and 11:30 PM Able to fall asleep easily Wakes up about once during the night  Final wake up time about 7 AM  About 15 pound weight gain in the last few years  History of hypertension, hypercholesterolemia  Denies any memory problems  Snoring usually just when she lays on her back or when she has a bit to drink      Review of Systems  Constitutional: Negative.   HENT: Positive for trouble swallowing.   Eyes: Negative.   Respiratory: Negative.   Cardiovascular: Negative.   Gastrointestinal: Negative.   Endocrine: Negative.   Genitourinary: Negative.   Musculoskeletal: Negative.   Allergic/Immunologic: Positive for environmental allergies.  Neurological: Negative.   Hematological: Negative.   Psychiatric/Behavioral: Positive for dysphoric mood.       Objective:   Physical Exam Constitutional:      Appearance: Normal appearance.  HENT:     Head: Normocephalic and atraumatic.     Mouth/Throat:     Mouth: Mucous membranes are moist.     Comments: Crowded oropharynx Eyes:     Conjunctiva/sclera: Conjunctivae normal.  Neck:     Musculoskeletal: Normal range of motion and neck supple. No neck rigidity.  Cardiovascular:     Rate and Rhythm: Normal rate and regular rhythm.     Pulses: Normal pulses.     Heart sounds: No murmur.  Pulmonary:     Effort: Pulmonary effort is normal. No respiratory distress.     Breath sounds: Normal breath sounds. No stridor. No wheezing or rhonchi.  Abdominal:     General: There is no distension.     Palpations:  There is no mass.     Tenderness: There is no abdominal tenderness.  Musculoskeletal: Normal range of motion.        General: No swelling.  Skin:    General: Skin is warm.  Neurological:     General: No focal deficit present.     Mental Status: She is alert.     Cranial Nerves: No cranial nerve deficit.  Psychiatric:        Mood and Affect: Mood normal.        Behavior: Behavior normal.       Assessment:     Moderate probability of significant sleep disordered breathing  Obesity  Crowded oropharynx      Plan:     We will schedule patient for home sleep study  Pathophysiology of sleep disordered breathing discussed with the patient  Treatment options of sleep disordered breathing discussed with the patient  Importance of weight loss, regular exercises discussed  Optimize sleep hygiene  Behavioral modifications with respect to alcohol use especially close to bedtime

## 2019-01-23 ENCOUNTER — Other Ambulatory Visit: Payer: Self-pay | Admitting: *Deleted

## 2019-01-23 DIAGNOSIS — M8580 Other specified disorders of bone density and structure, unspecified site: Secondary | ICD-10-CM

## 2019-01-28 ENCOUNTER — Ambulatory Visit: Payer: 59 | Admitting: Primary Care

## 2019-01-29 ENCOUNTER — Other Ambulatory Visit: Payer: Self-pay

## 2019-01-29 ENCOUNTER — Encounter: Payer: Self-pay | Admitting: Primary Care

## 2019-01-29 ENCOUNTER — Ambulatory Visit: Payer: 59 | Admitting: Primary Care

## 2019-01-29 DIAGNOSIS — I10 Essential (primary) hypertension: Secondary | ICD-10-CM | POA: Diagnosis not present

## 2019-01-29 LAB — BASIC METABOLIC PANEL
BUN: 14 mg/dL (ref 6–23)
CO2: 30 mEq/L (ref 19–32)
Calcium: 9.6 mg/dL (ref 8.4–10.5)
Chloride: 101 mEq/L (ref 96–112)
Creatinine, Ser: 0.96 mg/dL (ref 0.40–1.20)
GFR: 58.74 mL/min — ABNORMAL LOW (ref >60.00)
Glucose, Bld: 91 mg/dL (ref 70–99)
Potassium: 4.3 mEq/L (ref 3.5–5.1)
Sodium: 139 mEq/L (ref 135–145)

## 2019-01-29 MED ORDER — LISINOPRIL 10 MG PO TABS: 10.0000 mg | ORAL_TABLET | Freq: Every day | ORAL | 3 refills | Status: DC

## 2019-01-29 NOTE — Patient Instructions (Signed)
Continue taking lisinopril 10 mg daily for blood pressure.  Stop by the lab prior to leaving today. I will notify you of your results once received.   Please update me if the cough becomes bothersome.  It was a pleasure to see you today!

## 2019-01-29 NOTE — Assessment & Plan Note (Signed)
Improved on lisinopril 10 mg, BP at goal. Check BMP today. She will update if cough progresses. Refills sent to pharmacy.

## 2019-01-29 NOTE — Progress Notes (Signed)
Subjective:    Patient ID: Eli Phillips, female    DOB: 17-May-1956, 63 y.o.   MRN: [id number]  HPI   Ms. Kozma is a 63 year old female with a history of thyroid disease, prediabetes, hypertension who presents today for follow up of hypertension.  She is currently managed on lisinopril 10 mg daily which was initiated two weeks ago after noted elevated office and home BP readings.  Since her last visit she's checked her BP three times and got readings of 105/69, 126/75, 125/79. She denies cough, dizziness, headaches, chest pain, SOB. She has noticed a slight cough in the morning after taking lisinopril, overall not bothersome. She has since increased water intake, decreased coffee intake.  BP Readings from Last 3 Encounters:  01/29/19 122/82  01/15/19 124/76  01/13/19 (!) 144/70     Review of Systems  Eyes: Negative for visual disturbance.  Respiratory: Negative for cough and shortness of breath.   Cardiovascular: Negative for chest pain.  Neurological: Negative for dizziness.       Past Medical History:  Diagnosis Date  . Allergy    seasonal  . Chicken pox   . Depression   . Eczema   . Elevated cholesterol   . GERD (gastroesophageal reflux disease)   . Hypertension   . Insomnia    patient denies this at PAT appt 02/09/15  . Meniere disease   . Menopausal symptom    HOT FLASHES  . Osteopenia   . Thyroid disease    Hypothyroid     Social History   Socioeconomic History  . Marital status: Married    Spouse name: Not on file  . Number of children: Not on file  . Years of education: Not on file  . Highest education level: Not on file  Occupational History  . Not on file  Social Needs  . Financial resource strain: Not on file  . Food insecurity    Worry: Not on file    Inability: Not on file  . Transportation needs    Medical: Not on file    Non-medical: Not on file  Tobacco Use  . Smoking status: Former Smoker    Packs/day: 1.00    Years: 15.00    Pack  years: 15.00    Types: Cigarettes    Quit date: 11/09/1999    Years since quitting: 19.2  . Smokeless tobacco: Never Used  Substance and Sexual Activity  . Alcohol use: Yes    Alcohol/week: 14.0 standard drinks    Types: 14 Standard drinks or equivalent per week    Comment: 2 GLASSES OF WINE WITH DINNER   . Drug use: No  . Sexual activity: Not Currently    Birth control/protection: Post-menopausal    Comment: 1st intercourse-17, partners- 26, married- 10 yrs   Lifestyle  . Physical activity    Days per week: Not on file    Minutes per session: Not on file  . Stress: Not on file  Relationships  . Social Herbalist on phone: Not on file    Gets together: Not on file    Attends religious service: Not on file    Active member of club or organization: Not on file    Attends meetings of clubs or organizations: Not on file    Relationship status: Not on file  . Intimate partner violence    Fear of current or ex partner: Not on file    Emotionally abused: Not on  file    Physically abused: Not on file    Forced sexual activity: Not on file  Other Topics Concern  . Not on file  Social History Narrative   Married.   No children.   Works as an Optometrist.   Enjoys bowling, play computer games, crossword puzzles    Past Surgical History:  Procedure Laterality Date  . COLONOSCOPY    . TUBAL LIGATION  11/22/1998   [facility]/DR. GOTTSEGEN  . WISDOM TOOTH EXTRACTION      Family History  Problem Relation Age of Onset  . Hypertension Mother   . Heart disease Mother   . Cancer Mother        UTERINE  . Heart disease Father   . Breast cancer Paternal Aunt        age 29  . Colon cancer Neg Hx     No Known Allergies  Current Outpatient Medications on File Prior to Visit  Medication Sig Dispense Refill  . atorvastatin (LIPITOR) 40 MG tablet Take 1 tablet (40 mg total) by mouth every evening. For cholesterol. 90 tablet 3  . Calcium Carb-Cholecalciferol (CALCIUM 600 + D  PO) Take by mouth.    . cetirizine (ZYRTEC) 10 MG tablet Take 10 mg by mouth daily.    . citalopram (CELEXA) 20 MG tablet Take 1 tablet (20 mg total) by mouth daily. 90 tablet 3  . levothyroxine (SYNTHROID) 112 MCG tablet TAKE 1 TABLET BY MOUTH  EVERY MORNING ON AN EMPTY  STOMACH WITH A FULL GLASS  OF WATER. NO OTHER MEDS OR  FOOD FOR 30 MINUTES. 90 tablet 3  . lisinopril (ZESTRIL) 10 MG tablet Take 1 tablet (10 mg total) by mouth daily. For blood pressure. 30 tablet 0  . Multiple Vitamins-Minerals (CENTRUM SILVER PO) Take by mouth.    . vitamin B-12 (CYANOCOBALAMIN) 250 MCG tablet Take 250 mcg by mouth daily.    . hydrOXYzine (ATARAX/VISTARIL) 10 MG tablet Take 1 tablet (10 mg total) by mouth 2 (two) times daily as needed. (Patient not taking: Reported on 01/29/2019) 30 tablet 0   No current facility-administered medications on file prior to visit.     BP 122/82   Pulse (!) 56   Temp 98 F (36.7 C) (Temporal)   Ht 5' 0.5" (1.537 m)   Wt 173 lb 12 oz (78.8 kg)   SpO2 98%   BMI 33.37 kg/m    Objective:   Physical Exam  Constitutional: She appears well-nourished.  Neck: Neck supple.  Cardiovascular: Normal rate and regular rhythm.  Respiratory: Effort normal and breath sounds normal.  Skin: Skin is warm and dry.           Assessment & Plan:

## 2019-01-30 ENCOUNTER — Other Ambulatory Visit: Payer: Self-pay | Admitting: Primary Care

## 2019-01-30 DIAGNOSIS — N289 Disorder of kidney and ureter, unspecified: Secondary | ICD-10-CM

## 2019-02-03 ENCOUNTER — Other Ambulatory Visit: Payer: Self-pay

## 2019-02-03 ENCOUNTER — Ambulatory Visit: Payer: 59

## 2019-02-03 DIAGNOSIS — G4733 Obstructive sleep apnea (adult) (pediatric): Secondary | ICD-10-CM | POA: Diagnosis not present

## 2019-02-03 DIAGNOSIS — Z87898 Personal history of other specified conditions: Secondary | ICD-10-CM

## 2019-02-06 DIAGNOSIS — G4733 Obstructive sleep apnea (adult) (pediatric): Secondary | ICD-10-CM

## 2019-02-07 ENCOUNTER — Telehealth: Payer: Self-pay | Admitting: Pulmonary Disease

## 2019-02-07 NOTE — Telephone Encounter (Signed)
Called and spoke with patient. She states she doesn't want to start therapy at this time. She is working on losing weight and she wants a second opinion.   Nothing further needed at this time.

## 2019-02-07 NOTE — Telephone Encounter (Signed)
Dr. Ander Slade reviewed sleep study :  Recommend CPAP therapy for mild obstructive sleep apnea, auto titration CPAP with pressure settings of 5-15 will be appropriate. Close clinical follow up with compliance monitoring to optimize therapeutic efficiency. Weight loss measures encouraged. She should be cautioned against driving when sleepy and against medication with sedative side effects.

## 2019-02-10 ENCOUNTER — Other Ambulatory Visit: Payer: Self-pay

## 2019-02-11 ENCOUNTER — Ambulatory Visit (INDEPENDENT_AMBULATORY_CARE_PROVIDER_SITE_OTHER): Payer: 59

## 2019-02-11 ENCOUNTER — Other Ambulatory Visit: Payer: Self-pay | Admitting: Obstetrics & Gynecology

## 2019-02-11 DIAGNOSIS — M8580 Other specified disorders of bone density and structure, unspecified site: Secondary | ICD-10-CM

## 2019-02-11 DIAGNOSIS — Z780 Asymptomatic menopausal state: Secondary | ICD-10-CM | POA: Diagnosis not present

## 2019-02-11 DIAGNOSIS — Z13820 Encounter for screening for osteoporosis: Secondary | ICD-10-CM

## 2019-02-20 NOTE — Telephone Encounter (Signed)
Tammy please advise. Thanks. 

## 2019-02-20 NOTE — Telephone Encounter (Signed)
Sorry to hear this , please arrange an office visit with APP so we can go over her results , discuss dx and tx options with detailed patient education

## 2019-02-21 ENCOUNTER — Ambulatory Visit: Payer: 59 | Admitting: Primary Care

## 2019-02-21 ENCOUNTER — Encounter: Payer: Self-pay | Admitting: Primary Care

## 2019-02-21 ENCOUNTER — Other Ambulatory Visit: Payer: Self-pay

## 2019-02-21 VITALS — BP 134/80 | HR 62 | Temp 98.4°F | Ht 60.5 in | Wt 168.2 lb

## 2019-02-21 DIAGNOSIS — G4733 Obstructive sleep apnea (adult) (pediatric): Secondary | ICD-10-CM

## 2019-02-21 NOTE — Patient Instructions (Addendum)
HST showed mild sleep apnea with an average score of 9  Mild sleep apnea- experience 5-14 episodes of interrupted breathing in 1 hour Moderate sleep apnea- experiences 15-30 episodes of interrupted breathing in 1 hour Severe sleep apnea- experiences > 30 episodes of interrupted breathing in 1 hour    Untreated sleep apnea can increased your risk of developing heart problems including hypertension, heart failure, stroke, irregular heart beats and heart attack. It can also lead to decreased productivity, day time somnolence and increased risk for motor vehicle accident   For the most part obstructive sleep apnea is a chronic condition. CPAP is the most effective treatment for sleep apnea. Weight loss can help but may not reverse the condition. Other options include side sleeping position, referral to dentist for oral appliance consideration or ENT for possible surgical intervention if obstruction is d/t anatomical abnormality.     Sleep Apnea Sleep apnea affects breathing during sleep. It causes breathing to stop for a short time or to become shallow. It can also increase the risk of:  Heart attack.  Stroke.  Being very overweight (obese).  Diabetes.  Heart failure.  Irregular heartbeat. The goal of treatment is to help you breathe normally again. What are the causes? There are three kinds of sleep apnea:  Obstructive sleep apnea. This is caused by a blocked or collapsed airway.  Central sleep apnea. This happens when the brain does not send the right signals to the muscles that control breathing.  Mixed sleep apnea. This is a combination of obstructive and central sleep apnea. The most common cause of this condition is a collapsed or blocked airway. This can happen if:  Your throat muscles are too relaxed.  Your tongue and tonsils are too large.  You are overweight.  Your airway is too small. What increases the risk?  Being overweight.  Smoking.  Having a small  airway.  Being older.  Being female.  Drinking alcohol.  Taking medicines to calm yourself (sedatives or tranquilizers).  Having family members with the condition. What are the signs or symptoms?  Trouble staying asleep.  Being sleepy or tired during the day.  Getting angry a lot.  Loud snoring.  Headaches in the morning.  Not being able to focus your mind (concentrate).  Forgetting things.  Less interest in sex.  Mood swings.  Personality changes.  Feelings of sadness (depression).  Waking up a lot during the night to pee (urinate).  Dry mouth.  Sore throat. How is this diagnosed?  Your medical history.  A physical exam.  A test that is done when you are sleeping (sleep study). The test is most often done in a sleep lab but may also be done at home. How is this treated?   Sleeping on your side.  Using a medicine to get rid of mucus in your nose (decongestant).  Avoiding the use of alcohol, medicines to help you relax, or certain pain medicines (narcotics).  Losing weight, if needed.  Changing your diet.  Not smoking.  Using a machine to open your airway while you sleep, such as: ? An oral appliance. This is a mouthpiece that shifts your lower jaw forward. ? A CPAP device. This device blows air through a mask when you breathe out (exhale). ? An EPAP device. This has valves that you put in each nostril. ? A BPAP device. This device blows air through a mask when you breathe in (inhale) and breathe out.  Having surgery if other treatments do  not work. It is important to get treatment for sleep apnea. Without treatment, it can lead to:  High blood pressure.  Coronary artery disease.  In men, not being able to have an erection (impotence).  Reduced thinking ability. Follow these instructions at home: Lifestyle  Make changes that your doctor recommends.  Eat a healthy diet.  Lose weight if needed.  Avoid alcohol, medicines to help you  relax, and some pain medicines.  Do not use any products that contain nicotine or tobacco, such as cigarettes, e-cigarettes, and chewing tobacco. If you need help quitting, ask your doctor. General instructions  Take over-the-counter and prescription medicines only as told by your doctor.  If you were given a machine to use while you sleep, use it only as told by your doctor.  If you are having surgery, make sure to tell your doctor you have sleep apnea. You may need to bring your device with you.  Keep all follow-up visits as told by your doctor. This is important. Contact a doctor if:  The machine that you were given to use during sleep bothers you or does not seem to be working.  You do not get better.  You get worse. Get help right away if:  Your chest hurts.  You have trouble breathing in enough air.  You have an uncomfortable feeling in your back, arms, or stomach.  You have trouble talking.  One side of your body feels weak.  A part of your face is hanging down. These symptoms may be an emergency. Do not wait to see if the symptoms will go away. Get medical help right away. Call your local emergency services (911 in the [place] Do not drive yourself to the hospital. Summary  This condition affects breathing during sleep.  The most common cause is a collapsed or blocked airway.  The goal of treatment is to help you breathe normally while you sleep. This information is not intended to replace advice given to you by your health care provider. Make sure you discuss any questions you have with your health care provider. Document Released: 04/04/2008 Document Revised: 04/12/2018 Document Reviewed: 02/19/2018 Elsevier Patient Education  2020 [employer].

## 2019-02-21 NOTE — Progress Notes (Signed)
@Patient  ID: Bianca Lynch, female    DOB: 04-21-1956, 63 y.o.   MRN: [id number]  Chief Complaint  Patient presents with  . Follow-up    Discuss sleep study results.     Referring provider: Pleas Koch, NP  HPI: 63 year old female, former smoker. PMH significant for HTN, thyroid disease, osteopenia, prediabetes, depression, insomnia. Patient of Dr. Ander Slade, seen on 01/15/19 for snoring. HST showed mild obstructive sleep apnea. Recommended patient started on auto titrate CPAP 5-15cm H20. Patient declined CPAP and would like to lose weight.   02/21/2019 Patient presents today to review HST results. She is upset with how the testing results were reported to her over the phone. She has many questions regarding her results and treatment options. Home sleep test on 02/03/19 showed mild obstructive sleep apnea with AHI 9.1/hr. We discussed how untreated sleep apnea can increased your risk of developing heart problems including hypertension, heart failure, stroke, irregular heart beats and heart attack. It can also lead to decreased productivity, day time somnolence and increased risk for motor vehicle accident. She was asking if she had to be on CPAP therapy forever. She is somewhat overwhelmed at thinking about wearing a mask at night. Again discussed that for the most part obstructive sleep apnea is a chronic condition. CPAP is the most effective treatment for sleep apnea. Weight loss can help but may not reverse the condition. Other options include side sleeping position, referral to dentist for oral appliance consideration or ENT for possible surgical intervention if obstruction is d/t anatomical abnormality. She denies snoring.   No Known Allergies  Immunization History  Administered Date(s) Administered  . Influenza,inj,Quad PF,6+ Mos 04/12/2017, 04/06/2018  . Influenza-Unspecified 04/12/2017  . Tdap 11/11/2013  . Zoster 10/14/2012  . Zoster Recombinat (Shingrix) 04/12/2017, 09/04/2017     Past Medical History:  Diagnosis Date  . Allergy    seasonal  . Chicken pox   . Depression   . Eczema   . Elevated cholesterol   . GERD (gastroesophageal reflux disease)   . Hypertension   . Insomnia    patient denies this at PAT appt 02/09/15  . Meniere disease   . Menopausal symptom    HOT FLASHES  . Osteopenia   . Thyroid disease    Hypothyroid    Tobacco History: Social History   Tobacco Use  Smoking Status Former Smoker  . Packs/day: 1.00  . Years: 15.00  . Pack years: 15.00  . Types: Cigarettes  . Quit date: 11/09/1999  . Years since quitting: 19.2  Smokeless Tobacco Never Used   Counseling given: Not Answered   Outpatient Medications Prior to Visit  Medication Sig Dispense Refill  . atorvastatin (LIPITOR) 40 MG tablet Take 1 tablet (40 mg total) by mouth every evening. For cholesterol. 90 tablet 3  . Calcium Carb-Cholecalciferol (CALCIUM 600 + D PO) Take by mouth.    . cetirizine (ZYRTEC) 10 MG tablet Take 10 mg by mouth daily.    . citalopram (CELEXA) 20 MG tablet Take 1 tablet (20 mg total) by mouth daily. 90 tablet 3  . hydrOXYzine (ATARAX/VISTARIL) 10 MG tablet Take 1 tablet (10 mg total) by mouth 2 (two) times daily as needed. 30 tablet 0  . levothyroxine (SYNTHROID) 112 MCG tablet TAKE 1 TABLET BY MOUTH  EVERY MORNING ON AN EMPTY  STOMACH WITH A FULL GLASS  OF WATER. NO OTHER MEDS OR  FOOD FOR 30 MINUTES. 90 tablet 3  . lisinopril (ZESTRIL) 10 MG  tablet Take 1 tablet (10 mg total) by mouth daily. For blood pressure. 90 tablet 3  . Multiple Vitamins-Minerals (CENTRUM SILVER PO) Take by mouth.    . vitamin B-12 (CYANOCOBALAMIN) 250 MCG tablet Take 250 mcg by mouth daily.     No facility-administered medications prior to visit.    Review of Systems  Review of Systems  Constitutional: Negative.   Respiratory: Negative.  Negative for cough and shortness of breath.   Cardiovascular: Negative.    Physical Exam  BP 134/80 (BP Location: Left Arm, Cuff  Size: Normal)   Pulse 62   Temp 98.4 F (36.9 C) (Oral)   Ht 5' 0.5" (1.537 m)   Wt 168 lb 3.2 oz (76.3 kg)   SpO2 98%   BMI 32.31 kg/m  Physical Exam Constitutional:      General: She is not in acute distress.    Appearance: Normal appearance.  HENT:     Head: Normocephalic and atraumatic.  Neck:     Musculoskeletal: Normal range of motion.  Cardiovascular:     Rate and Rhythm: Normal rate and regular rhythm.     Comments: Regular rate and rhythm  Pulmonary:     Effort: Pulmonary effort is normal.     Breath sounds: Normal breath sounds.     Comments: Clear lung sounds Skin:    General: Skin is warm and dry.  Neurological:     General: No focal deficit present.     Mental Status: She is alert. Mental status is at baseline.  Psychiatric:        Mood and Affect: Mood normal.        Behavior: Behavior normal.        Thought Content: Thought content normal.        Judgment: Judgment normal.     Comments: Upset/tearful      Lab Results:  CBC    Component Value Date/Time   WBC 8.0 09/28/2015 1507   RBC 4.31 09/28/2015 1507   HGB 13.8 09/28/2015 1507   HCT 40.6 09/28/2015 1507   PLT 286.0 09/28/2015 1507   MCV 94.2 09/28/2015 1507   MCHC 33.9 09/28/2015 1507   RDW 13.8 09/28/2015 1507    BMET    Component Value Date/Time   NA 139 01/29/2019 1137   K 4.3 01/29/2019 1137   CL 101 01/29/2019 1137   CO2 30 01/29/2019 1137   GLUCOSE 91 01/29/2019 1137   BUN 14 01/29/2019 1137   CREATININE 0.96 01/29/2019 1137   CALCIUM 9.6 01/29/2019 1137    BNP No results found for: BNP  ProBNP No results found for: PROBNP  Imaging: No results found.   Assessment & Plan:   OSA (obstructive sleep apnea) - HST showed mild OSA with AHI 9.1/hr - Discussed treatment options and consequences of untreated sleep apnea at length - She is somewhat reluctant to start CPAP therapy, ultimately this is her decision and she decided to proceed with CPAP order  - Referral to DME  company CPAP start, auto titrate 5-15cm h20, mask of choice, supplies and humidification - Advised patient wear every night >4 hours. No driving if experiencing excessive daytime fatigue or somnolence - Follow-up in 1-3 months to review air-view download. Discussed what this appointment will entail.       Martyn Ehrich, NP 02/21/2019

## 2019-02-21 NOTE — Assessment & Plan Note (Addendum)
-   HST showed mild OSA with AHI 9.1/hr - Discussed treatment options and consequences of untreated sleep apnea at length - She is somewhat reluctant to start CPAP therapy, ultimately this is her decision and she decided to proceed with CPAP order  - Referral to DME company CPAP start, auto titrate 5-15cm h20, mask of choice, supplies and humidification - Advised patient wear every night >4 hours. No driving if experiencing excessive daytime fatigue or somnolence - Follow-up in 1-3 months to review air-view download. Discussed what this appointment will entail.

## 2019-03-12 ENCOUNTER — Other Ambulatory Visit: Payer: Self-pay | Admitting: Obstetrics & Gynecology

## 2019-03-12 DIAGNOSIS — Z1231 Encounter for screening mammogram for malignant neoplasm of breast: Secondary | ICD-10-CM

## 2019-03-14 ENCOUNTER — Ambulatory Visit
Admission: RE | Admit: 2019-03-14 | Discharge: 2019-03-14 | Disposition: A | Discharge status: Home / Self Care | Payer: 59 | Source: Ambulatory Visit | Attending: Obstetrics & Gynecology | Admitting: Obstetrics & Gynecology

## 2019-03-14 ENCOUNTER — Other Ambulatory Visit: Payer: Self-pay

## 2019-03-14 DIAGNOSIS — Z1231 Encounter for screening mammogram for malignant neoplasm of breast: Secondary | ICD-10-CM

## 2019-05-01 ENCOUNTER — Other Ambulatory Visit: Payer: Self-pay

## 2019-05-01 ENCOUNTER — Encounter: Payer: Self-pay | Admitting: Pulmonary Disease

## 2019-05-01 ENCOUNTER — Ambulatory Visit: Payer: 59 | Admitting: Pulmonary Disease

## 2019-05-01 VITALS — BP 118/64 | HR 51 | Ht 60.6 in | Wt 159.6 lb

## 2019-05-01 DIAGNOSIS — G4733 Obstructive sleep apnea (adult) (pediatric): Secondary | ICD-10-CM

## 2019-05-01 DIAGNOSIS — Z9989 Dependence on other enabling machines and devices: Secondary | ICD-10-CM

## 2019-05-01 NOTE — Patient Instructions (Signed)
Obstructive sleep apnea adequately treated with CPAP therapy  Continue CPAP use Continue weight loss efforts  We will see you back in about 6 months With any significant concerns

## 2019-05-01 NOTE — Progress Notes (Signed)
Bianca Lynch    [id number]    [birth year]  Primary Care Physician:Clark, Leticia Penna, NP  Referring Physician: Pleas Koch, NP [place],  [place] [address]  Chief complaint:   Patient with mild obstructive sleep apnea has been using CPAP In for follow-up  HPI:  Diagnosed with mild obstructive sleep apnea Has been on CPAP therapy She is compliant with CPAP use Feels better Has managed to lose about 20 pounds recently  Sleeps well at night  Outpatient Encounter Medications as of 05/01/2019  Medication Sig  . atorvastatin (LIPITOR) 40 MG tablet Take 1 tablet (40 mg total) by mouth every evening. For cholesterol.  . Calcium Carb-Cholecalciferol (CALCIUM 600 + D PO) Take by mouth.  . cetirizine (ZYRTEC) 10 MG tablet Take 10 mg by mouth daily.  . citalopram (CELEXA) 20 MG tablet Take 1 tablet (20 mg total) by mouth daily.  . hydrOXYzine (ATARAX/VISTARIL) 10 MG tablet Take 1 tablet (10 mg total) by mouth 2 (two) times daily as needed.  Marland Kitchen levothyroxine (SYNTHROID) 112 MCG tablet TAKE 1 TABLET BY MOUTH  EVERY MORNING ON AN EMPTY  STOMACH WITH A FULL GLASS  OF WATER. NO OTHER MEDS OR  FOOD FOR 30 MINUTES.  Marland Kitchen lisinopril (ZESTRIL) 10 MG tablet Take 1 tablet (10 mg total) by mouth daily. For blood pressure.  . Multiple Vitamins-Minerals (CENTRUM SILVER PO) Take by mouth.  . vitamin B-12 (CYANOCOBALAMIN) 250 MCG tablet Take 250 mcg by mouth daily.   No facility-administered encounter medications on file as of 05/01/2019.     Allergies as of 05/01/2019  . (No Known Allergies)    Past Medical History:  Diagnosis Date  . Allergy    seasonal  . Chicken pox   . Depression   . Eczema   . Elevated cholesterol   . GERD (gastroesophageal reflux disease)   . Hypertension   . Insomnia    patient denies this at PAT appt 02/09/15  . Meniere disease   . Menopausal symptom    HOT FLASHES  . Osteopenia   . Thyroid disease    Hypothyroid    Past  Surgical History:  Procedure Laterality Date  . COLONOSCOPY    . TUBAL LIGATION  11/22/1998   [facility]/DR. G[facility]SE[facility]  . WISDOM TOOTH EXTRACTION      Family History  Problem Relation Age of Onset  . Hypertension Mother   . Heart disease Mother   . Cancer Mother        UTERINE  . Heart disease Father   . Breast cancer Paternal Aunt        age 10  . Colon cancer Neg Hx     Social History   Socioeconomic History  . Marital status: Married    Spouse name: Not on file  . Number of children: Not on file  . Years of education: Not on file  . Highest education level: Not on file  Occupational History  . Not on file  Social Needs  . Financial resource strain: Not on file  . Food insecurity    Worry: Not on file    Inability: Not on file  . Transportation needs    Medical: Not on file    Non-medical: Not on file  Tobacco Use  . Smoking status: Former Smoker    Packs/day: 1.00    Years: 15.00    Pack years: 15.00    Types: Cigarettes  Quit date: 11/09/1999    Years since quitting: 19.4  . Smokeless tobacco: Never Used  Substance and Sexual Activity  . Alcohol use: Yes    Alcohol/week: 14.0 standard drinks    Types: 14 Standard drinks or equivalent per week    Comment: 2 GLASSES OF WINE WITH DINNER   . Drug use: No  . Sexual activity: Not Currently    Birth control/protection: Post-menopausal    Comment: 1st intercourse-17, partners- 74, married- 10 yrs   Lifestyle  . Physical activity    Days per week: Not on file    Minutes per session: Not on file  . Stress: Not on file  Relationships  . Social Herbalist on phone: Not on file    Gets together: Not on file    Attends religious service: Not on file    Active member of club or organization: Not on file    Attends meetings of clubs or organizations: Not on file    Relationship status: Not on file  . Intimate partner violence    Fear of current or ex partner: Not on file    Emotionally abused: Not on  file    Physically abused: Not on file    Forced sexual activity: Not on file  Other Topics Concern  . Not on file  Social History Narrative   Married.   No children.   Works as an Optometrist.   Enjoys bowling, play computer games, crossword puzzles    Review of Systems  Constitutional: Negative.   HENT: Negative.   Respiratory: Positive for apnea.   Cardiovascular: Negative.   Gastrointestinal: Negative.   Psychiatric/Behavioral: Positive for sleep disturbance.    Vitals:   05/01/19 1022  BP: 118/64  Pulse: (!) 51  SpO2: 95%   Physical Exam  Constitutional: She is oriented to person, place, and time. She appears well-developed and well-nourished.  HENT:  Head: Normocephalic and atraumatic.  Eyes: Pupils are equal, round, and reactive to light. Right eye exhibits no discharge. Left eye exhibits no discharge.  Neck: Normal range of motion. Neck supple. No tracheal deviation present. No thyromegaly present.  Cardiovascular: Normal rate and regular rhythm.  Pulmonary/Chest: Effort normal and breath sounds normal. No respiratory distress. She has no wheezes. She has no rales. She exhibits no tenderness.  Abdominal: Soft.  Musculoskeletal: Normal range of motion.        General: Edema present.  Neurological: She is alert and oriented to person, place, and time.  Skin: Skin is warm. No erythema.  Psychiatric: She has a normal mood and affect.   Compliance data reveals 100% compliance On auto titration of 5-15 95 percentile of 9.5 Residual AHI of 2.1  Results of the Epworth flowsheet 05/01/2019 01/15/2019  Sitting and reading 0 1  Watching TV 0 0  Sitting, inactive in a public place (e.g. a theatre or a meeting) 0 0  As a passenger in a car for an hour without a break 0 1  Lying down to rest in the afternoon when circumstances permit 0 2  Sitting and talking to someone 0 0  Sitting quietly after a lunch without alcohol 0 0  In a car, while stopped for a few minutes in  traffic 0 0  Total score 0 4   Assessment:  Mild obstructive sleep apnea well treated with CPAP therapy .  Compliant with CPAP .  Encouraged to continue CPAP on a regular basis Obesity .  Improving .  Continue weight loss efforts  Plan/Recommendations: We will follow-up in 6 months  Encouraged to call with any significant concerns   Sherrilyn Rist MD [facility] 05/01/2019, 10:42 AM  CC: Pleas Koch, NP

## 2019-05-06 ENCOUNTER — Other Ambulatory Visit (INDEPENDENT_AMBULATORY_CARE_PROVIDER_SITE_OTHER): Payer: 59

## 2019-05-06 DIAGNOSIS — N289 Disorder of kidney and ureter, unspecified: Secondary | ICD-10-CM | POA: Diagnosis not present

## 2019-05-06 LAB — BASIC METABOLIC PANEL
BUN: 11 mg/dL (ref 6–23)
CO2: 32 mEq/L (ref 19–32)
Calcium: 9.5 mg/dL (ref 8.4–10.5)
Chloride: 102 mEq/L (ref 96–112)
Creatinine, Ser: 0.81 mg/dL (ref 0.40–1.20)
GFR: 71.4 mL/min (ref >60.00)
Glucose, Bld: 93 mg/dL (ref 70–99)
Potassium: 4.1 mEq/L (ref 3.5–5.1)
Sodium: 140 mEq/L (ref 135–145)

## 2019-11-04 ENCOUNTER — Other Ambulatory Visit: Payer: Self-pay | Admitting: Primary Care

## 2019-11-04 DIAGNOSIS — E785 Hyperlipidemia, unspecified: Secondary | ICD-10-CM

## 2019-11-23 ENCOUNTER — Other Ambulatory Visit: Payer: Self-pay | Admitting: Primary Care

## 2019-11-23 DIAGNOSIS — F3342 Major depressive disorder, recurrent, in full remission: Secondary | ICD-10-CM

## 2019-12-10 ENCOUNTER — Other Ambulatory Visit: Payer: Self-pay

## 2019-12-11 ENCOUNTER — Encounter: Payer: Self-pay | Admitting: Obstetrics & Gynecology

## 2019-12-11 ENCOUNTER — Ambulatory Visit (INDEPENDENT_AMBULATORY_CARE_PROVIDER_SITE_OTHER): Payer: 59 | Admitting: Obstetrics & Gynecology

## 2019-12-11 VITALS — BP 130/70 | Ht 60.5 in | Wt 142.0 lb

## 2019-12-11 DIAGNOSIS — Z780 Asymptomatic menopausal state: Secondary | ICD-10-CM | POA: Diagnosis not present

## 2019-12-11 DIAGNOSIS — E663 Overweight: Secondary | ICD-10-CM

## 2019-12-11 DIAGNOSIS — Z01419 Encounter for gynecological examination (general) (routine) without abnormal findings: Secondary | ICD-10-CM | POA: Diagnosis not present

## 2019-12-11 NOTE — Progress Notes (Signed)
Bianca Lynch Bianca Lynch Jan 21, 1956 [id number]   History:    64 y.o. G0 Married  RP:  Established patient presenting for annual gyn exam   HPI: Postmenopause, well on no HRT.  No PMB.  No pelvic pain.  Abstinent.  Urine/BMs normal.  Breasts normal.  BMI decreased to 27.28 with the No[facility] program.  Walking.  Health Labs with Fam. NP.    Colonoscopy 2016.   Past medical history,surgical history, family history and social history were all reviewed and documented in the EPIC chart.  Gynecologic History No LMP recorded. Patient is postmenopausal.  Obstetric History OB History  Gravida Para Term Preterm AB Living  0            SAB TAB Ectopic Multiple Live Births                ROS: A ROS was performed and pertinent positives and negatives are included in the history.  GENERAL: No fevers or chills. HEENT: No change in vision, no earache, sore throat or sinus congestion. NECK: No pain or stiffness. CARDIOVASCULAR: No chest pain or pressure. No palpitations. PULMONARY: No shortness of breath, cough or wheeze. GASTROINTESTINAL: No abdominal pain, nausea, vomiting or diarrhea, melena or bright red blood per rectum. GENITOURINARY: No urinary frequency, urgency, hesitancy or dysuria. MUSCULOSKELETAL: No joint or muscle pain, no back pain, no recent trauma. DERMATOLOGIC: No rash, no itching, no lesions. ENDOCRINE: No polyuria, polydipsia, no heat or cold intolerance. No recent change in weight. HEMATOLOGICAL: No anemia or easy bruising or bleeding. NEUROLOGIC: No headache, seizures, numbness, tingling or weakness. PSYCHIATRIC: No depression, no loss of interest in normal activity or change in sleep pattern.     Exam:   BP 130/70    Ht 5' 0.5" (1.537 m)    Wt 142 lb (64.4 kg)    BMI 27.28 kg/m   Body mass index is 27.28 kg/m.  General appearance : Well developed well nourished female. No acute distress HEENT: Eyes: no retinal hemorrhage or exudates,  Neck supple, trachea midline, no carotid  bruits, no thyroidmegaly Lungs: Clear to auscultation, no rhonchi or wheezes, or rib retractions  Heart: Regular rate and rhythm, no murmurs or gallops Breast:Examined in sitting and supine position were symmetrical in appearance, no palpable masses or tenderness,  no skin retraction, no nipple inversion, no nipple discharge, no skin discoloration, no axillary or supraclavicular lymphadenopathy Abdomen: no palpable masses or tenderness, no rebound or guarding Extremities: no edema or skin discoloration or tenderness  Pelvic: Vulva: Normal             Vagina: No gross lesions or discharge  Cervix: No gross lesions or discharge  Uterus AV, normal size, shape and consistency, non-tender and mobile  Adnexa  Without masses or tenderness  Anus: Normal   Assessment/Plan:  64 y.o. female for annual exam   1. Well female exam with routine gynecological exam Normal gynecologic exam in menopause.  Pap test May 2020 was negative, no indication to repeat this year.  Breast exam normal.  Screening mammogram September 2020 was negative.  Colonoscopy 2016.  Health labs with family nurse practitioner.  2. Postmenopause Well on no hormone replacement therapy.  No postmenopausal bleeding.  Bone density August 2020 was normal, will repeat at 5 years.  Vitamin D supplements, calcium intake of 1200 mg daily and regular weightbearing physical activities to continue.  3. Overweight (BMI 25.0-29.9) Planning to continue to loose weight with Noom.  Princess Bruins MD, 10:33 AM 12/11/2019

## 2019-12-16 ENCOUNTER — Encounter: Payer: Self-pay | Admitting: Obstetrics & Gynecology

## 2019-12-16 NOTE — Patient Instructions (Signed)
1. Well female exam with routine gynecological exam Normal gynecologic exam in menopause.  Pap test May 2020 was negative, no indication to repeat this year.  Breast exam normal.  Screening mammogram September 2020 was negative.  Colonoscopy 2016.  Health labs with family nurse practitioner.  2. Postmenopause Well on no hormone replacement therapy.  No postmenopausal bleeding.  Bone density August 2020 was normal, will repeat at 5 years.  Vitamin D supplements, calcium intake of 1200 mg daily and regular weightbearing physical activities to continue.  3. Overweight (BMI 25.0-29.9) Planning to continue to loose weight with Noom.  Bianca Lynch, it was a pleasure seeing you today!

## 2019-12-26 ENCOUNTER — Other Ambulatory Visit: Payer: Self-pay | Admitting: Primary Care

## 2019-12-26 DIAGNOSIS — E039 Hypothyroidism, unspecified: Secondary | ICD-10-CM

## 2019-12-30 NOTE — Telephone Encounter (Signed)
Letter sent to patient that she will be due for appointment in July

## 2020-01-07 ENCOUNTER — Other Ambulatory Visit: Payer: Self-pay | Admitting: Primary Care

## 2020-01-07 DIAGNOSIS — E785 Hyperlipidemia, unspecified: Secondary | ICD-10-CM

## 2020-01-07 DIAGNOSIS — I10 Essential (primary) hypertension: Secondary | ICD-10-CM

## 2020-01-07 DIAGNOSIS — R7303 Prediabetes: Secondary | ICD-10-CM

## 2020-01-07 DIAGNOSIS — Z114 Encounter for screening for human immunodeficiency virus [HIV]: Secondary | ICD-10-CM

## 2020-01-07 DIAGNOSIS — E039 Hypothyroidism, unspecified: Secondary | ICD-10-CM

## 2020-01-09 ENCOUNTER — Other Ambulatory Visit: Payer: Self-pay

## 2020-01-09 ENCOUNTER — Other Ambulatory Visit (INDEPENDENT_AMBULATORY_CARE_PROVIDER_SITE_OTHER): Payer: 59

## 2020-01-09 DIAGNOSIS — R7303 Prediabetes: Secondary | ICD-10-CM

## 2020-01-09 DIAGNOSIS — E785 Hyperlipidemia, unspecified: Secondary | ICD-10-CM | POA: Diagnosis not present

## 2020-01-09 DIAGNOSIS — Z114 Encounter for screening for human immunodeficiency virus [HIV]: Secondary | ICD-10-CM

## 2020-01-09 DIAGNOSIS — E039 Hypothyroidism, unspecified: Secondary | ICD-10-CM

## 2020-01-09 DIAGNOSIS — I10 Essential (primary) hypertension: Secondary | ICD-10-CM

## 2020-01-09 LAB — LIPID PANEL
Cholesterol: 149 mg/dL (ref 0–200)
HDL: 56.6 mg/dL (ref >39.00)
LDL Cholesterol: 79 mg/dL (ref 0–99)
NonHDL: 92.82
Total CHOL/HDL Ratio: 3
Triglycerides: 71 mg/dL (ref 0.0–149.0)
VLDL: 14.2 mg/dL (ref 0.0–40.0)

## 2020-01-09 LAB — CBC
HCT: 38.4 % (ref 36.0–46.0)
Hemoglobin: 13 g/dL (ref 12.0–15.0)
MCHC: 33.8 g/dL (ref 30.0–36.0)
MCV: 97.3 fl (ref 78.0–100.0)
Platelets: 230 10*3/uL (ref 150.0–400.0)
RBC: 3.95 Mil/uL (ref 3.87–5.11)
RDW: 13 % (ref 11.5–15.5)
WBC: 5.6 10*3/uL (ref 4.0–10.5)

## 2020-01-09 LAB — COMPREHENSIVE METABOLIC PANEL
ALT: 18 U/L (ref 0–35)
AST: 17 U/L (ref 0–37)
Albumin: 4.5 g/dL (ref 3.5–5.2)
Alkaline Phosphatase: 46 U/L (ref 39–117)
BUN: 14 mg/dL (ref 6–23)
CO2: 33 mEq/L — ABNORMAL HIGH (ref 19–32)
Calcium: 9.4 mg/dL (ref 8.4–10.5)
Chloride: 100 mEq/L (ref 96–112)
Creatinine, Ser: 0.87 mg/dL (ref 0.40–1.20)
GFR: 65.61 mL/min (ref >60.00)
Glucose, Bld: 83 mg/dL (ref 70–99)
Potassium: 4.2 mEq/L (ref 3.5–5.1)
Sodium: 138 mEq/L (ref 135–145)
Total Bilirubin: 0.9 mg/dL (ref 0.2–1.2)
Total Protein: 7 g/dL (ref 6.0–8.3)

## 2020-01-09 LAB — HEMOGLOBIN A1C: Hgb A1c MFr Bld: 5.4 % (ref 4.6–6.5)

## 2020-01-09 LAB — TSH: TSH: 0.98 u[IU]/mL (ref 0.35–4.50)

## 2020-01-10 LAB — HIV ANTIBODY (ROUTINE TESTING W REFLEX): HIV 1&2 Ab, 4th Generation: NONREACTIVE

## 2020-01-14 ENCOUNTER — Encounter: Payer: 59 | Admitting: Primary Care

## 2020-01-16 ENCOUNTER — Other Ambulatory Visit: Payer: Self-pay

## 2020-01-16 ENCOUNTER — Ambulatory Visit (INDEPENDENT_AMBULATORY_CARE_PROVIDER_SITE_OTHER): Payer: 59 | Admitting: Primary Care

## 2020-01-16 VITALS — BP 112/70 | HR 68 | Temp 96.6°F | Ht 60.5 in | Wt 141.5 lb

## 2020-01-16 DIAGNOSIS — G4700 Insomnia, unspecified: Secondary | ICD-10-CM

## 2020-01-16 DIAGNOSIS — F3342 Major depressive disorder, recurrent, in full remission: Secondary | ICD-10-CM

## 2020-01-16 DIAGNOSIS — E079 Disorder of thyroid, unspecified: Secondary | ICD-10-CM | POA: Diagnosis not present

## 2020-01-16 DIAGNOSIS — Z0000 Encounter for general adult medical examination without abnormal findings: Secondary | ICD-10-CM

## 2020-01-16 DIAGNOSIS — Z8742 Personal history of other diseases of the female genital tract: Secondary | ICD-10-CM

## 2020-01-16 DIAGNOSIS — M8589 Other specified disorders of bone density and structure, multiple sites: Secondary | ICD-10-CM

## 2020-01-16 DIAGNOSIS — G4733 Obstructive sleep apnea (adult) (pediatric): Secondary | ICD-10-CM | POA: Diagnosis not present

## 2020-01-16 DIAGNOSIS — I10 Essential (primary) hypertension: Secondary | ICD-10-CM | POA: Diagnosis not present

## 2020-01-16 DIAGNOSIS — R7303 Prediabetes: Secondary | ICD-10-CM

## 2020-01-16 DIAGNOSIS — E7800 Pure hypercholesterolemia, unspecified: Secondary | ICD-10-CM

## 2020-01-16 NOTE — Assessment & Plan Note (Signed)
Following with GYN, last pap smear in 2020 negative.

## 2020-01-16 NOTE — Assessment & Plan Note (Signed)
Compliant to CPAP machine, doing very well since weight loss. Continue same.

## 2020-01-16 NOTE — Assessment & Plan Note (Signed)
Well controlled in the office today, commended her on weight loss. CMP reviewed. Continue lisinopril.

## 2020-01-16 NOTE — Progress Notes (Signed)
Subjective:    Patient ID: Bianca Lynch, female    DOB: September 08, 1955, 64 y.o.   MRN: [id number]  HPI  This visit occurred during the SARS-CoV-2 public health emergency.  Safety protocols were in place, including screening questions prior to the visit, additional usage of staff PPE, and extensive cleaning of exam room while observing appropriate contact time as indicated for disinfecting solutions.   Bianca Lynch is a 64 year old female who presents today for complete physical.  Immunizations: -Tetanus: Completed in 2015 -Influenza: Completed last season  -Shingles: Completed Shingrix -Covid-19: Completed series  Diet: She endorses a healthy diet.  Exercise: She is not exercising  Eye exam: Due in September 2021 Dental exam: Completes semi-annually   Pap Smear: Completed in 2020 Mammogram: Completed in 2020 Colonoscopy: Completed in 2016, due in 2026 Hep C Screen: Negative  BP Readings from Last 3 Encounters:  01/16/20 112/70  12/11/19 130/70  05/01/19 118/64   Wt Readings from Last 3 Encounters:  01/16/20 141 lb 8 oz (64.2 kg)  12/11/19 142 lb (64.4 kg)  05/01/19 159 lb 9.6 oz (72.4 kg)      Review of Systems  Constitutional: Negative for unexpected weight change.  HENT: Negative for rhinorrhea.   Respiratory: Negative for cough and shortness of breath.   Cardiovascular: Negative for chest pain.  Gastrointestinal: Negative for constipation and diarrhea.  Genitourinary: Negative for difficulty urinating.  Musculoskeletal: Negative for arthralgias and myalgias.  Skin: Negative for rash.  Allergic/Immunologic: Negative for environmental allergies.  Neurological: Negative for dizziness, numbness and headaches.  Psychiatric/Behavioral: The patient is not nervous/anxious.        Past Medical History:  Diagnosis Date  . Allergy    seasonal  . Chicken pox   . Depression   . Eczema   . Elevated cholesterol   . GERD (gastroesophageal reflux disease)   .  Hypertension   . Insomnia    patient denies this at PAT appt 02/09/15  . Meniere disease   . Menopausal symptom    HOT FLASHES  . Osteopenia   . Thyroid disease    Hypothyroid     Social History   Socioeconomic History  . Marital status: Married    Spouse name: Not on file  . Number of children: Not on file  . Years of education: Not on file  . Highest education level: Not on file  Occupational History  . Not on file  Tobacco Use  . Smoking status: Former Smoker    Packs/day: 1.00    Years: 15.00    Pack years: 15.00    Types: Cigarettes    Quit date: 11/09/1999    Years since quitting: 20.2  . Smokeless tobacco: Never Used  Vaping Use  . Vaping Use: Never used  Substance and Sexual Activity  . Alcohol use: Yes    Alcohol/week: 14.0 standard drinks    Types: 14 Standard drinks or equivalent per week    Comment: 2 GLASSES OF WINE WITH DINNER   . Drug use: No  . Sexual activity: Not Currently    Birth control/protection: Post-menopausal    Comment: 1st intercourse-17, partners- 79, married- 2 yrs   Other Topics Concern  . Not on file  Social History Narrative   Married.   No children.   Works as an Optometrist.   Enjoys bowling, play computer games, crossword puzzles   Social Determinants of Health   Financial Resource Strain:   . Difficulty of Paying Living Expenses:  Food Insecurity:   . Worried About Charity fundraiser in the Last Year:   . Arboriculturist in the Last Year:   Transportation Needs:   . Film/video editor (Medical):   Marland Kitchen Lack of Transportation (Non-Medical):   Physical Activity:   . Days of Exercise per Week:   . Minutes of Exercise per Session:   Stress:   . Feeling of Stress :   Social Connections:   . Frequency of Communication with Friends and Family:   . Frequency of Social Gatherings with Friends and Family:   . Attends Religious Services:   . Active Member of Clubs or Organizations:   . Attends Archivist Meetings:    Marland Kitchen Marital Status:   Intimate Partner Violence:   . Fear of Current or Ex-Partner:   . Emotionally Abused:   Marland Kitchen Physically Abused:   . Sexually Abused:     Past Surgical History:  Procedure Laterality Date  . COLONOSCOPY    . TUBAL LIGATION  11/22/1998   [facility]/DR. GOTTSEGEN  . WISDOM TOOTH EXTRACTION      Family History  Problem Relation Age of Onset  . Hypertension Mother   . Heart disease Mother   . Cancer Mother        UTERINE  . Heart disease Father   . Breast cancer Paternal Aunt        age 28  . Colon cancer Neg Hx     No Known Allergies  Current Outpatient Medications on File Prior to Visit  Medication Sig Dispense Refill  . atorvastatin (LIPITOR) 40 MG tablet TAKE 1 TABLET BY MOUTH IN  THE EVENING FOR CHOLESTEROL 90 tablet 0  . Calcium Carb-Cholecalciferol (CALCIUM 600 + D PO) Take by mouth.    . cetirizine (ZYRTEC) 10 MG tablet Take 10 mg by mouth daily.    . citalopram (CELEXA) 20 MG tablet Take 1 tablet (20 mg total) by mouth daily. NEEDS OFFICE VISIT 90 tablet 0  . hydrOXYzine (ATARAX/VISTARIL) 10 MG tablet Take 1 tablet (10 mg total) by mouth 2 (two) times daily as needed. 30 tablet 0  . levothyroxine (SYNTHROID) 112 MCG tablet TAKE 1 TABLET BY MOUTH  EVERY MORNING ON AN EMPTY  STOMACH WITH A FULL GLASS  OF WATER. NO OTHER MEDS OR  FOOD FOR 30 MINUTES. 90 tablet 0  . lisinopril (ZESTRIL) 10 MG tablet Take 1 tablet (10 mg total) by mouth daily. For blood pressure. 90 tablet 3  . Multiple Vitamins-Minerals (CENTRUM SILVER PO) Take by mouth.    . vitamin B-12 (CYANOCOBALAMIN) 250 MCG tablet Take 250 mcg by mouth daily.     No current facility-administered medications on file prior to visit.    BP 112/70   Pulse 68   Temp (!) 96.6 F (35.9 C) (Temporal)   Ht 5' 0.5" (1.537 m)   Wt 141 lb 8 oz (64.2 kg)   SpO2 98%   BMI 27.18 kg/m    Objective:   Physical Exam HENT:     Right Ear: Tympanic membrane and ear canal normal.     Left Ear: Tympanic  membrane and ear canal normal.  Eyes:     Pupils: Pupils are equal, round, and reactive to light.  Cardiovascular:     Rate and Rhythm: Normal rate and regular rhythm.  Pulmonary:     Effort: Pulmonary effort is normal.     Breath sounds: Normal breath sounds.  Abdominal:  General: Bowel sounds are normal.     Palpations: Abdomen is soft.     Tenderness: There is no abdominal tenderness.  Musculoskeletal:        General: Normal range of motion.     Cervical back: Neck supple.  Skin:    General: Skin is warm and dry.  Neurological:     Mental Status: She is alert and oriented to person, place, and time.     Cranial Nerves: No cranial nerve deficit.     Deep Tendon Reflexes:     Reflex Scores:      Patellar reflexes are 2+ on the right side and 2+ on the left side.           Assessment & Plan:

## 2020-01-16 NOTE — Patient Instructions (Signed)
Start exercising. You should be getting 150 minutes of moderate intensity exercise weekly.  Continue to work on a healthy diet. Ensure you are consuming 64 ounces of water daily.  It was a pleasure to see you today! Congratulations on your weight loss!   Preventive Care 31-64 Years Old, Female Preventive care refers to visits with your health care provider and lifestyle choices that can promote health and wellness. This includes:  A yearly physical exam. This may also be called an annual well check.  Regular dental visits and eye exams.  Immunizations.  Screening for certain conditions.  Healthy lifestyle choices, such as eating a healthy diet, getting regular exercise, not using drugs or products that contain nicotine and tobacco, and limiting alcohol use. What can I expect for my preventive care visit? Physical exam Your health care provider will check your:  Height and weight. This may be used to calculate body mass index (BMI), which tells if you are at a healthy weight.  Heart rate and blood pressure.  Skin for abnormal spots. Counseling Your health care provider may ask you questions about your:  Alcohol, tobacco, and drug use.  Emotional well-being.  Home and relationship well-being.  Sexual activity.  Eating habits.  Work and work Statistician.  Method of birth control.  Menstrual cycle.  Pregnancy history. What immunizations do I need?  Influenza (flu) vaccine  This is recommended every year. Tetanus, diphtheria, and pertussis (Tdap) vaccine  You may need a Td booster every 10 years. Varicella (chickenpox) vaccine  You may need this if you have not been vaccinated. Zoster (shingles) vaccine  You may need this after age 38. Measles, mumps, and rubella (MMR) vaccine  You may need at least one dose of MMR if you were born in 1957 or later. You may also need a second dose. Pneumococcal conjugate (PCV13) vaccine  You may need this if you have  certain conditions and were not previously vaccinated. Pneumococcal polysaccharide (PPSV23) vaccine  You may need one or two doses if you smoke cigarettes or if you have certain conditions. Meningococcal conjugate (MenACWY) vaccine  You may need this if you have certain conditions. Hepatitis A vaccine  You may need this if you have certain conditions or if you travel or work in places where you may be exposed to hepatitis A. Hepatitis B vaccine  You may need this if you have certain conditions or if you travel or work in places where you may be exposed to hepatitis B. Haemophilus influenzae type b (Hib) vaccine  You may need this if you have certain conditions. Human papillomavirus (HPV) vaccine  If recommended by your health care provider, you may need three doses over 6 months. You may receive vaccines as individual doses or as more than one vaccine together in one shot (combination vaccines). Talk with your health care provider about the risks and benefits of combination vaccines. What tests do I need? Blood tests  Lipid and cholesterol levels. These may be checked every 5 years, or more frequently if you are over 80 years old.  Hepatitis C test.  Hepatitis B test. Screening  Lung cancer screening. You may have this screening every year starting at age 21 if you have a 30-pack-year history of smoking and currently smoke or have quit within the past 15 years.  Colorectal cancer screening. All adults should have this screening starting at age 20 and continuing until age 42. Your health care provider may recommend screening at age 62 if you  are at increased risk. You will have tests every 1-10 years, depending on your results and the type of screening test.  Diabetes screening. This is done by checking your blood sugar (glucose) after you have not eaten for a while (fasting). You may have this done every 1-3 years.  Mammogram. This may be done every 1-2 years. Talk with your  health care provider about when you should start having regular mammograms. This may depend on whether you have a family history of breast cancer.  BRCA-related cancer screening. This may be done if you have a family history of breast, ovarian, tubal, or peritoneal cancers.  Pelvic exam and Pap test. This may be done every 3 years starting at age 86. Starting at age 30, this may be done every 5 years if you have a Pap test in combination with an HPV test. Other tests  Sexually transmitted disease (STD) testing.  Bone density scan. This is done to screen for osteoporosis. You may have this scan if you are at high risk for osteoporosis. Follow these instructions at home: Eating and drinking  Eat a diet that includes fresh fruits and vegetables, whole grains, lean protein, and low-fat dairy.  Take vitamin and mineral supplements as recommended by your health care provider.  Do not drink alcohol if: ? Your health care provider tells you not to drink. ? You are pregnant, may be pregnant, or are planning to become pregnant.  If you drink alcohol: ? Limit how much you have to 0-1 drink a day. ? Be aware of how much alcohol is in your drink. In the [place] one drink equals one 12 oz bottle of beer (355 mL), one 5 oz glass of wine (148 mL), or one 1 oz glass of hard liquor (44 mL). Lifestyle  Take daily care of your teeth and gums.  Stay active. Exercise for at least 30 minutes on 5 or more days each week.  Do not use any products that contain nicotine or tobacco, such as cigarettes, e-cigarettes, and chewing tobacco. If you need help quitting, ask your health care provider.  If you are sexually active, practice safe sex. Use a condom or other form of birth control (contraception) in order to prevent pregnancy and STIs (sexually transmitted infections).  If told by your health care provider, take low-dose aspirin daily starting at age 42. What's next?  Visit your health care provider once  a year for a well check visit.  Ask your health care provider how often you should have your eyes and teeth checked.  Stay up to date on all vaccines. This information is not intended to replace advice given to you by your health care provider. Make sure you discuss any questions you have with your health care provider. Document Revised: 03/07/2018 Document Reviewed: 03/07/2018 Elsevier Patient Education  2020 [employer].

## 2020-01-16 NOTE — Assessment & Plan Note (Signed)
Improved with use of CPAP machine. Continue same.

## 2020-01-16 NOTE — Assessment & Plan Note (Signed)
Resolved with weight loss, commended her on this achievement!

## 2020-01-16 NOTE — Assessment & Plan Note (Signed)
Well controlled on atorvastatin, commended her on weight loss efforts.

## 2020-01-16 NOTE — Assessment & Plan Note (Signed)
Recent TSH stable. She is taking levothyroxine correctly. Continue 112 mcg.

## 2020-01-16 NOTE — Assessment & Plan Note (Signed)
Immunizations UTD. Pap smear and mammogram UTD. Colonoscopy UTD, due in 2026. Commended her on weight loss through diet and exercise. Exam today unremarkable. Labs reviewed.

## 2020-01-16 NOTE — Assessment & Plan Note (Signed)
Compliant to calcium and vitamin D. Encouraged weight bearing exercise.

## 2020-01-16 NOTE — Assessment & Plan Note (Signed)
Doing well on citalopram 20 mg, continue same.  

## 2020-01-24 ENCOUNTER — Other Ambulatory Visit: Payer: Self-pay | Admitting: Primary Care

## 2020-01-24 DIAGNOSIS — E785 Hyperlipidemia, unspecified: Secondary | ICD-10-CM

## 2020-02-05 ENCOUNTER — Other Ambulatory Visit: Payer: Self-pay | Admitting: Primary Care

## 2020-02-05 DIAGNOSIS — I10 Essential (primary) hypertension: Secondary | ICD-10-CM

## 2020-02-12 ENCOUNTER — Other Ambulatory Visit: Payer: Self-pay | Admitting: Primary Care

## 2020-02-12 DIAGNOSIS — F3342 Major depressive disorder, recurrent, in full remission: Secondary | ICD-10-CM

## 2020-02-13 NOTE — Telephone Encounter (Signed)
Last OV 01/16/20 Next OV not scheduled  Last RX sent 11/24/19

## 2020-04-30 ENCOUNTER — Other Ambulatory Visit: Payer: Self-pay | Admitting: Primary Care

## 2020-04-30 DIAGNOSIS — Z1231 Encounter for screening mammogram for malignant neoplasm of breast: Secondary | ICD-10-CM

## 2020-05-03 ENCOUNTER — Other Ambulatory Visit: Payer: Self-pay | Admitting: Primary Care

## 2020-05-03 DIAGNOSIS — Z1231 Encounter for screening mammogram for malignant neoplasm of breast: Secondary | ICD-10-CM

## 2020-05-06 ENCOUNTER — Ambulatory Visit
Admission: RE | Admit: 2020-05-06 | Discharge: 2020-05-06 | Disposition: A | Discharge status: Home / Self Care | Payer: BC Managed Care – PPO | Source: Ambulatory Visit | Attending: Primary Care | Admitting: Primary Care

## 2020-05-06 ENCOUNTER — Other Ambulatory Visit: Payer: Self-pay

## 2020-05-06 DIAGNOSIS — Z1231 Encounter for screening mammogram for malignant neoplasm of breast: Secondary | ICD-10-CM

## 2020-07-09 ENCOUNTER — Other Ambulatory Visit: Payer: Self-pay

## 2020-07-09 DIAGNOSIS — E039 Hypothyroidism, unspecified: Secondary | ICD-10-CM

## 2020-07-13 MED ORDER — LEVOTHYROXINE SODIUM 112 MCG PO TABS: ORAL_TABLET | ORAL | 0 refills | Status: DC

## 2020-08-09 DIAGNOSIS — F3342 Major depressive disorder, recurrent, in full remission: Secondary | ICD-10-CM

## 2020-08-10 ENCOUNTER — Other Ambulatory Visit: Payer: Self-pay

## 2020-08-10 DIAGNOSIS — E785 Hyperlipidemia, unspecified: Secondary | ICD-10-CM

## 2020-08-10 MED ORDER — ATORVASTATIN CALCIUM 40 MG PO TABS: ORAL_TABLET | ORAL | 1 refills | Status: DC

## 2020-08-11 MED ORDER — CITALOPRAM HYDROBROMIDE 20 MG PO TABS: 20.0000 mg | ORAL_TABLET | Freq: Every day | ORAL | 1 refills | Status: DC

## 2020-10-12 ENCOUNTER — Other Ambulatory Visit: Payer: Self-pay | Admitting: Primary Care

## 2020-10-12 DIAGNOSIS — E039 Hypothyroidism, unspecified: Secondary | ICD-10-CM

## 2020-11-11 ENCOUNTER — Encounter: Payer: Self-pay | Admitting: Pulmonary Disease

## 2020-11-11 ENCOUNTER — Other Ambulatory Visit: Payer: Self-pay

## 2020-11-11 ENCOUNTER — Ambulatory Visit (INDEPENDENT_AMBULATORY_CARE_PROVIDER_SITE_OTHER): Payer: 59 | Admitting: Pulmonary Disease

## 2020-11-11 VITALS — BP 118/72 | HR 57 | Temp 97.4°F | Ht 60.5 in | Wt 142.6 lb

## 2020-11-11 DIAGNOSIS — G4733 Obstructive sleep apnea (adult) (pediatric): Secondary | ICD-10-CM

## 2020-11-11 DIAGNOSIS — Z9989 Dependence on other enabling machines and devices: Secondary | ICD-10-CM

## 2020-11-11 NOTE — Progress Notes (Signed)
Bianca Lynch    [id number]    [birth year]  Primary Care Physician:Clark, Leticia Penna, NP  Referring Physician: Pleas Koch, NP [place],  [place] [address]  Chief complaint:   Patient with mild obstructive sleep apnea has been using CPAP In for follow-up today, has no acute issues currently  HPI:  Diagnosed with mild obstructive sleep apnea Has been on CPAP therapy She is compliant with CPAP use Feels better overall  Continues to lose weight  Sleeps well at night, functions well during the day   Outpatient Encounter Medications as of 11/11/2020  Medication Sig  . atorvastatin (LIPITOR) 40 MG tablet TAKE 1 TABLET BY MOUTH IN  THE EVENING FOR CHOLESTEROL  . Calcium Carb-Cholecalciferol (CALCIUM 600 + D PO) Take by mouth.  . Calcium Carbonate-Vitamin D 600-200 MG-UNIT TABS Take by mouth.  . cetirizine (ZYRTEC) 10 MG tablet Take 10 mg by mouth daily.  . citalopram (CELEXA) 20 MG tablet Take 1 tablet (20 mg total) by mouth daily. For depression.  Marland Kitchen levothyroxine (SYNTHROID) 112 MCG tablet TAKE 1 TABLET BY MOUTH EVERY MORNING ON AN EMPTY STOMACH WITH A FULL GLASS OF WATER. NO OTHER MEDS OR FOOD FOR 30 MINUTES.  Marland Kitchen lisinopril (ZESTRIL) 10 MG tablet TAKE 1 TABLET BY MOUTH EVERY DAY FOR BLOOD PRESSURE  . Multiple Vitamins-Minerals (CENTRUM SILVER PO) Take by mouth.  . vitamin B-12 (CYANOCOBALAMIN) 250 MCG tablet Take 250 mcg by mouth daily.  . [DISCONTINUED] hydrOXYzine (ATARAX/VISTARIL) 10 MG tablet Take 1 tablet (10 mg total) by mouth 2 (two) times daily as needed.   No facility-administered encounter medications on file as of 11/11/2020.    Allergies as of 11/11/2020  . (No Known Allergies)    Past Medical History:  Diagnosis Date  . Allergy    seasonal  . Chicken pox   . Depression   . Eczema   . Elevated cholesterol   . GERD (gastroesophageal reflux disease)   . Hypertension   . Insomnia    patient denies this at PAT appt 02/09/15  .  Meniere disease   . Menopausal symptom    HOT FLASHES  . Osteopenia   . Thyroid disease    Hypothyroid    Past Surgical History:  Procedure Laterality Date  . COLONOSCOPY    . TUBAL LIGATION  11/22/1998   [facility]/DR. GOTTSEGEN  . WISDOM TOOTH EXTRACTION      Family History  Problem Relation Age of Onset  . Hypertension Mother   . Heart disease Mother   . Cancer Mother        UTERINE  . Heart disease Father   . Breast cancer Paternal Aunt        age 14  . Colon cancer Neg Hx     Social History   Socioeconomic History  . Marital status: Married    Spouse name: Not on file  . Number of children: Not on file  . Years of education: Not on file  . Highest education level: Not on file  Occupational History  . Not on file  Tobacco Use  . Smoking status: Former Smoker    Packs/day: 1.00    Years: 15.00    Pack years: 15.00    Types: Cigarettes    Quit date: 11/09/1999    Years since quitting: 21.0  . Smokeless tobacco: Never Used  Vaping Use  . Vaping Use: Never used  Substance and Sexual  Activity  . Alcohol use: Yes    Alcohol/week: 14.0 standard drinks    Types: 14 Standard drinks or equivalent per week    Comment: 2 GLASSES OF WINE WITH DINNER   . Drug use: No  . Sexual activity: Not Currently    Birth control/protection: Post-menopausal    Comment: 1st intercourse-17, partners- 55, married- 28 yrs   Other Topics Concern  . Not on file  Social History Narrative   Married.   No children.   Works as an Optometrist.   Enjoys bowling, play computer games, crossword puzzles   Social Determinants of Health   Financial Resource Strain: Not on file  Food Insecurity: Not on file  Transportation Needs: Not on file  Physical Activity: Not on file  Stress: Not on file  Social Connections: Not on file  Intimate Partner Violence: Not on file    Review of Systems  Constitutional: Negative.   HENT: Negative.   Respiratory: Positive for apnea.   Cardiovascular:  Negative.   Gastrointestinal: Negative.   Psychiatric/Behavioral: Positive for sleep disturbance.    Vitals:   11/11/20 1419  BP: 118/72  Pulse: (!) 57  Temp: (!) 97.4 F (36.3 C)  SpO2: 99%   Physical Exam Constitutional:      Appearance: She is well-developed.  HENT:     Head: Normocephalic and atraumatic.  Eyes:     General:        Right eye: No discharge.        Left eye: No discharge.  Neck:     Thyroid: No thyromegaly.     Trachea: No tracheal deviation.  Cardiovascular:     Rate and Rhythm: Normal rate and regular rhythm.  Pulmonary:     Effort: Pulmonary effort is normal. No respiratory distress.     Breath sounds: Normal breath sounds. No wheezing or rales.  Chest:     Chest wall: No tenderness.  Abdominal:     Palpations: Abdomen is soft.  Musculoskeletal:     Cervical back: No rigidity or tenderness.  Neurological:     Mental Status: She is alert.  Psychiatric:        Mood and Affect: Mood normal.    Compliance data reveals 100% compliance CPAP settings 5-15 Residual AHI of 2.5 95 percentile pressure of 10.1  Results of the Epworth flowsheet 05/01/2019 01/15/2019  Sitting and reading 0 1  Watching TV 0 0  Sitting, inactive in a public place (e.g. a theatre or a meeting) 0 0  As a passenger in a car for an hour without a break 0 1  Lying down to rest in the afternoon when circumstances permit 0 2  Sitting and talking to someone 0 0  Sitting quietly after a lunch without alcohol 0 0  In a car, while stopped for a few minutes in traffic 0 0  Total score 0 4   Assessment:  Mild obstructive sleep apnea well treated with CPAP therapy .  Compliant with CPAP .  Encouraged to continue using CPAP on a regular basis  Obesity .  Encouraged to continue weight loss efforts  Plan/Recommendations: Follow-up appointment 1 year from now  Encouraged to call with any significant concerns   Sherrilyn Rist MD [facility] 11/11/2020,  2:42 PM  CC: Pleas Koch, NP

## 2020-11-11 NOTE — Patient Instructions (Signed)
History of mild obstructive sleep apnea Excellent compliance with CPAP  Continue CPAP on a regular basis  I will see you back in about a year  Call with any significant concerns

## 2020-12-15 ENCOUNTER — Ambulatory Visit: Payer: 59 | Admitting: Obstetrics & Gynecology

## 2020-12-15 ENCOUNTER — Other Ambulatory Visit: Payer: Self-pay

## 2020-12-15 ENCOUNTER — Encounter: Payer: Self-pay | Admitting: Obstetrics & Gynecology

## 2020-12-15 ENCOUNTER — Ambulatory Visit (INDEPENDENT_AMBULATORY_CARE_PROVIDER_SITE_OTHER): Payer: 59 | Admitting: Obstetrics & Gynecology

## 2020-12-15 VITALS — BP 128/76 | Ht 60.25 in | Wt 143.4 lb

## 2020-12-15 DIAGNOSIS — Z01419 Encounter for gynecological examination (general) (routine) without abnormal findings: Secondary | ICD-10-CM | POA: Diagnosis not present

## 2020-12-15 DIAGNOSIS — Z780 Asymptomatic menopausal state: Secondary | ICD-10-CM | POA: Diagnosis not present

## 2020-12-15 NOTE — Progress Notes (Signed)
    Bianca Lynch Bilal [birth year] [id number]   History:    65 y.o. G0 Married  FX:[id number] presenting for annual gyn exam   TMH:[id number], well on no HRT. No PMB. No pelvic pain. Abstinent.  Pap Neg 11/2018.Urine/BMs normal. Breasts normal.  Screening mammo neg 04/2020. BMI decreased to 27.77 with the [facility] program. Walking. Health Labs with [facility] NP. Colonoscopy 2016.  Bone Density normal in 02/2019.  Past medical history,surgical history, family history and social history were all reviewed and documented in the EPIC chart.  Gynecologic History No LMP recorded. Patient is postmenopausal.  Obstetric History OB History  Gravida Para Term Preterm AB Living  0            SAB IAB Ectopic Multiple Live Births                ROS: A ROS was performed and pertinent positives and negatives are included in the history.  GENERAL: No fevers or chills. HEENT: No change in vision, no earache, sore throat or sinus congestion. NECK: No pain or stiffness. CARDIOVASCULAR: No chest pain or pressure. No palpitations. PULMONARY: No shortness of breath, cough or wheeze. GASTROINTESTINAL: No abdominal pain, nausea, vomiting or diarrhea, melena or bright red blood per rectum. GENITOURINARY: No urinary frequency, urgency, hesitancy or dysuria. MUSCULOSKELETAL: No joint or muscle pain, no back pain, no recent trauma. DERMATOLOGIC: No rash, no itching, no lesions. ENDOCRINE: No polyuria, polydipsia, no heat or cold intolerance. No recent change in weight. HEMATOLOGICAL: No anemia or easy bruising or bleeding. NEUROLOGIC: No headache, seizures, numbness, tingling or weakness. PSYCHIATRIC: No depression, no loss of interest in normal activity or change in sleep pattern.     Exam:   BP 128/76   Ht 5' 0.25" (1.53 m)   Wt 143 lb 6.4 oz (65 kg)   BMI 27.77 kg/m   Body mass index is 27.77 kg/m.  General appearance : Well developed well nourished female. No acute distress HEENT: Eyes: no  retinal hemorrhage or exudates,  Neck supple, trachea midline, no carotid bruits, no thyroidmegaly Lungs: Clear to auscultation, no rhonchi or wheezes, or rib retractions  Heart: Regular rate and rhythm, no murmurs or gallops Breast:Examined in sitting and supine position were symmetrical in appearance, no palpable masses or tenderness,  no skin retraction, no nipple inversion, no nipple discharge, no skin discoloration, no axillary or supraclavicular lymphadenopathy Abdomen: no palpable masses or tenderness, no rebound or guarding Extremities: no edema or skin discoloration or tenderness  Pelvic: Vulva: Normal             Vagina: No gross lesions or discharge  Cervix: No gross lesions or discharge  Uterus  AV, normal size, shape and consistency, non-tender and mobile  Adnexa  Without masses or tenderness  Anus: Normal   Assessment/Plan:  65 y.o. female for annual exam   1. Well female exam with routine gynecological exam Normal gynecologic exam.  Pap test May 2020 was negative, will repeat a Pap test at 3 years next year.  Breast exam normal.  Screening mammogram October 2021 was negative.  Colonoscopy 2016.  Health labs with family physician.  Improved body mass index at 27.77.  Continue with healthy nutrition and walking.  2. Postmenopause Well on no hormone replacement therapy.  No postmenopausal bleeding.  Bone density August 2020 was normal.  Princess Bruins MD, 9:08 AM 12/15/2020

## 2021-01-11 ENCOUNTER — Other Ambulatory Visit: Payer: Self-pay | Admitting: Primary Care

## 2021-01-11 DIAGNOSIS — E039 Hypothyroidism, unspecified: Secondary | ICD-10-CM

## 2021-02-04 ENCOUNTER — Other Ambulatory Visit: Payer: Self-pay | Admitting: Primary Care

## 2021-02-04 DIAGNOSIS — E785 Hyperlipidemia, unspecified: Secondary | ICD-10-CM

## 2021-02-04 DIAGNOSIS — I10 Essential (primary) hypertension: Secondary | ICD-10-CM

## 2021-02-04 DIAGNOSIS — F3342 Major depressive disorder, recurrent, in full remission: Secondary | ICD-10-CM

## 2021-02-06 ENCOUNTER — Other Ambulatory Visit: Payer: Self-pay | Admitting: Primary Care

## 2021-02-06 DIAGNOSIS — E039 Hypothyroidism, unspecified: Secondary | ICD-10-CM

## 2021-02-27 ENCOUNTER — Other Ambulatory Visit: Payer: Self-pay | Admitting: Primary Care

## 2021-02-27 DIAGNOSIS — F3342 Major depressive disorder, recurrent, in full remission: Secondary | ICD-10-CM

## 2021-02-27 DIAGNOSIS — I10 Essential (primary) hypertension: Secondary | ICD-10-CM

## 2021-02-27 DIAGNOSIS — E785 Hyperlipidemia, unspecified: Secondary | ICD-10-CM

## 2021-03-09 ENCOUNTER — Ambulatory Visit: Payer: 59 | Admitting: Primary Care

## 2021-03-11 ENCOUNTER — Other Ambulatory Visit: Payer: Self-pay

## 2021-03-11 ENCOUNTER — Encounter: Payer: Self-pay | Admitting: Primary Care

## 2021-03-11 ENCOUNTER — Ambulatory Visit (INDEPENDENT_AMBULATORY_CARE_PROVIDER_SITE_OTHER): Payer: 59 | Admitting: Primary Care

## 2021-03-11 VITALS — BP 110/78 | HR 82 | Temp 97.2°F | Ht 60.25 in | Wt 141.0 lb

## 2021-03-11 DIAGNOSIS — E785 Hyperlipidemia, unspecified: Secondary | ICD-10-CM | POA: Diagnosis not present

## 2021-03-11 DIAGNOSIS — Z0000 Encounter for general adult medical examination without abnormal findings: Secondary | ICD-10-CM

## 2021-03-11 DIAGNOSIS — F3342 Major depressive disorder, recurrent, in full remission: Secondary | ICD-10-CM | POA: Diagnosis not present

## 2021-03-11 DIAGNOSIS — G4733 Obstructive sleep apnea (adult) (pediatric): Secondary | ICD-10-CM

## 2021-03-11 DIAGNOSIS — Z1231 Encounter for screening mammogram for malignant neoplasm of breast: Secondary | ICD-10-CM

## 2021-03-11 DIAGNOSIS — E039 Hypothyroidism, unspecified: Secondary | ICD-10-CM | POA: Diagnosis not present

## 2021-03-11 DIAGNOSIS — I10 Essential (primary) hypertension: Secondary | ICD-10-CM | POA: Diagnosis not present

## 2021-03-11 DIAGNOSIS — Z23 Encounter for immunization: Secondary | ICD-10-CM | POA: Diagnosis not present

## 2021-03-11 DIAGNOSIS — G4700 Insomnia, unspecified: Secondary | ICD-10-CM

## 2021-03-11 LAB — COMPREHENSIVE METABOLIC PANEL
ALT: 18 U/L (ref 0–35)
AST: 16 U/L (ref 0–37)
Albumin: 4.2 g/dL (ref 3.5–5.2)
Alkaline Phosphatase: 44 U/L (ref 39–117)
BUN: 15 mg/dL (ref 6–23)
CO2: 32 mEq/L (ref 19–32)
Calcium: 9.5 mg/dL (ref 8.4–10.5)
Chloride: 103 mEq/L (ref 96–112)
Creatinine, Ser: 0.76 mg/dL (ref 0.40–1.20)
GFR: 82.53 mL/min (ref >60.00)
Glucose, Bld: 74 mg/dL (ref 70–99)
Potassium: 4.4 mEq/L (ref 3.5–5.1)
Sodium: 140 mEq/L (ref 135–145)
Total Bilirubin: 0.4 mg/dL (ref 0.2–1.2)
Total Protein: 6.7 g/dL (ref 6.0–8.3)

## 2021-03-11 LAB — LIPID PANEL
Cholesterol: 141 mg/dL (ref 0–200)
HDL: 50.2 mg/dL (ref >39.00)
LDL Cholesterol: 75 mg/dL (ref 0–99)
NonHDL: 90.93
Total CHOL/HDL Ratio: 3
Triglycerides: 81 mg/dL (ref 0.0–149.0)
VLDL: 16.2 mg/dL (ref 0.0–40.0)

## 2021-03-11 LAB — CBC
HCT: 38.9 % (ref 36.0–46.0)
Hemoglobin: 12.9 g/dL (ref 12.0–15.0)
MCHC: 33.2 g/dL (ref 30.0–36.0)
MCV: 96.5 fl (ref 78.0–100.0)
Platelets: 241 10*3/uL (ref 150.0–400.0)
RBC: 4.04 Mil/uL (ref 3.87–5.11)
RDW: 12.6 % (ref 11.5–15.5)
WBC: 5 10*3/uL (ref 4.0–10.5)

## 2021-03-11 LAB — TSH: TSH: 0.13 u[IU]/mL — ABNORMAL LOW (ref 0.35–5.50)

## 2021-03-11 MED ORDER — LEVOTHYROXINE SODIUM 112 MCG PO TABS: ORAL_TABLET | ORAL | 3 refills | Status: DC

## 2021-03-11 MED ORDER — ATORVASTATIN CALCIUM 40 MG PO TABS: 40.0000 mg | ORAL_TABLET | Freq: Every day | ORAL | 3 refills | Status: DC

## 2021-03-11 MED ORDER — LISINOPRIL 10 MG PO TABS: ORAL_TABLET | ORAL | 3 refills | Status: DC

## 2021-03-11 MED ORDER — CITALOPRAM HYDROBROMIDE 20 MG PO TABS: 20.0000 mg | ORAL_TABLET | Freq: Every day | ORAL | 3 refills | Status: DC

## 2021-03-11 NOTE — Assessment & Plan Note (Signed)
She is taking levothyroxine 112 mcg correctly, continue same. Repeat TSH pending.

## 2021-03-11 NOTE — Assessment & Plan Note (Signed)
Compliant to atorvastatin 40 mg, continue same. Repeat lipid panel pending.

## 2021-03-11 NOTE — Assessment & Plan Note (Signed)
Doing well on CPAP machine, compliant nightly, continue same.

## 2021-03-11 NOTE — Assessment & Plan Note (Signed)
Overall stable, continue citalopram 20 mg.

## 2021-03-11 NOTE — Patient Instructions (Addendum)
Stop by the lab prior to leaving today. I will notify you of your results once received.   It was a pleasure to see you today!  Preventive Care 51-65 Years Old, Female Preventive care refers to lifestyle choices and visits with your health care provider that can promote health and wellness. This includes: A yearly physical exam. This is also called an annual wellness visit. Regular dental and eye exams. Immunizations. Screening for certain conditions. Healthy lifestyle choices, such as: Eating a healthy diet. Getting regular exercise. Not using drugs or products that contain nicotine and tobacco. Limiting alcohol use. What can I expect for my preventive care visit? Physical exam Your health care provider will check your: Height and weight. These may be used to calculate your BMI (body mass index). BMI is a measurement that tells if you are at a healthy weight. Heart rate and blood pressure. Body temperature. Skin for abnormal spots. Counseling Your health care provider may ask you questions about your: Past medical problems. Family's medical history. Alcohol, tobacco, and drug use. Emotional well-being. Home life and relationship well-being. Sexual activity. Diet, exercise, and sleep habits. Work and work Statistician. Access to firearms. Method of birth control. Menstrual cycle. Pregnancy history. What immunizations do I need? Vaccines are usually given at various ages, according to a schedule. Your health care provider will recommend vaccines for you based on your age, medical history, and lifestyle or other factors, such as travel or where you work. What tests do I need? Blood tests Lipid and cholesterol levels. These may be checked every 5 years, or more often if you are over 66 years old. Hepatitis C test. Hepatitis B test. Screening Lung cancer screening. You may have this screening every year starting at age 73 if you have a 30-pack-year history of smoking and  currently smoke or have quit within the past 15 years. Colorectal cancer screening. All adults should have this screening starting at age 79 and continuing until age 9. Your health care provider may recommend screening at age 77 if you are at increased risk. You will have tests every 1-10 years, depending on your results and the type of screening test. Diabetes screening. This is done by checking your blood sugar (glucose) after you have not eaten for a while (fasting). You may have this done every 1-3 years. Mammogram. This may be done every 1-2 years. Talk with your health care provider about when you should start having regular mammograms. This may depend on whether you have a family history of breast cancer. BRCA-related cancer screening. This may be done if you have a family history of breast, ovarian, tubal, or peritoneal cancers. Pelvic exam and Pap test. This may be done every 3 years starting at age 42. Starting at age 3, this may be done every 5 years if you have a Pap test in combination with an HPV test. Other tests STD (sexually transmitted disease) testing, if you are at risk. Bone density scan. This is done to screen for osteoporosis. You may have this scan if you are at high risk for osteoporosis. Talk with your health care provider about your test results, treatment options, and if necessary, the need for more tests. Follow these instructions at home: Eating and drinking  Eat a diet that includes fresh fruits and vegetables, whole grains, lean protein, and low-fat dairy products. Take vitamin and mineral supplements as recommended by your health care provider. Do not drink alcohol if: Your health care provider tells you not to  drink. You are pregnant, may be pregnant, or are planning to become pregnant. If you drink alcohol: Limit how much you have to 0-1 drink a day. Be aware of how much alcohol is in your drink. In the [place] one drink equals one 12 oz bottle of beer  (355 mL), one 5 oz glass of wine (148 mL), or one 1 oz glass of hard liquor (44 mL). Lifestyle Take daily care of your teeth and gums. Brush your teeth every morning and night with fluoride toothpaste. Floss one time each day. Stay active. Exercise for at least 30 minutes 5 or more days each week. Do not use any products that contain nicotine or tobacco, such as cigarettes, e-cigarettes, and chewing tobacco. If you need help quitting, ask your health care provider. Do not use drugs. If you are sexually active, practice safe sex. Use a condom or other form of protection to prevent STIs (sexually transmitted infections). If you do not wish to become pregnant, use a form of birth control. If you plan to become pregnant, see your health care provider for a prepregnancy visit. If told by your health care provider, take low-dose aspirin daily starting at age 15. Find healthy ways to cope with stress, such as: Meditation, yoga, or listening to music. Journaling. Talking to a trusted person. Spending time with friends and family. Safety Always wear your seat belt while driving or riding in a vehicle. Do not drive: If you have been drinking alcohol. Do not ride with someone who has been drinking. When you are tired or distracted. While texting. Wear a helmet and other protective equipment during sports activities. If you have firearms in your house, make sure you follow all gun safety procedures. What's next? Visit your health care provider once a year for an annual wellness visit. Ask your health care provider how often you should have your eyes and teeth checked. Stay up to date on all vaccines. This information is not intended to replace advice given to you by your health care provider. Make sure you discuss any questions you have with your health care provider. Document Revised: 09/03/2020 Document Reviewed: 03/07/2018 Elsevier Patient Education  [employer].         Influenza  (Flu) Vaccine (Inactivated or Recombinant): What You Need to Know 1. Why get vaccinated? Influenza vaccine can prevent influenza (flu). Flu is a contagious disease that spreads around the [place] every year, usually between October and May. Anyone can get the flu, but it is more dangerous for some people. Infants and young children, people 2 years and older, pregnant people, and people with certain health conditions or a weakened immune system are at greatest risk of flu complications. Pneumonia, bronchitis, sinus infections, and ear infections are examples of flu-related complications. If you have a medical condition, such as heart disease, cancer, or diabetes, flu can make it worse. Flu can cause fever and chills, sore throat, muscle aches, fatigue, cough, headache, and runny or stuffy nose. Some people may have vomiting and diarrhea, though this is more common in children than adults. In an average year, thousands of people in the [place] die from flu, and many more are hospitalized. Flu vaccine prevents millions of illnesses and flu-related visits to the doctor each year. 2. Influenza vaccines CDC recommends everyone 6 months and older get vaccinated every flu season. Children 6 months through 8 years of age may need 2 doses during a single flu season. Everyone else needs only 1 dose  each flu season. It takes about 2 weeks for protection to develop after vaccination. There are many flu viruses, and they are always changing. Each year a new flu vaccine is made to protect against the influenza viruses believed to be likely to cause disease in the upcoming flu season. Even when the vaccine doesn't exactly match these viruses, it may still provide some protection. Influenza vaccine does not cause flu. Influenza vaccine may be given at the same time as other vaccines. 3. Talk with your health care provider Tell your vaccination provider if the person getting the vaccine: Has had an  allergic reaction after a previous dose of influenza vaccine, or has any severe, life-threatening allergies Has ever had Guillain-Barr Syndrome (also called "GBS") In some cases, your health care provider may decide to postpone influenza vaccination until a future visit. Influenza vaccine can be administered at any time during pregnancy. People who are or will be pregnant during influenza season should receive inactivated influenza vaccine. People with minor illnesses, such as a cold, may be vaccinated. People who are moderately or severely ill should usually wait until they recover before getting influenza vaccine. Your health care provider can give you more information. 4. Risks of a vaccine reaction Soreness, redness, and swelling where the shot is given, fever, muscle aches, and headache can happen after influenza vaccination. There may be a very small increased risk of Guillain-Barr Syndrome (GBS) after inactivated influenza vaccine (the flu shot). Young children who get the flu shot along with pneumococcal vaccine (PCV13) and/or DTaP vaccine at the same time might be slightly more likely to have a seizure caused by fever. Tell your health care provider if a child who is getting flu vaccine has ever had a seizure. People sometimes faint after medical procedures, including vaccination. Tell your provider if you feel dizzy or have vision changes or ringing in the ears. As with any medicine, there is a very remote chance of a vaccine causing a severe allergic reaction, other serious injury, or death. 5. What if there is a serious problem? An allergic reaction could occur after the vaccinated person leaves the clinic. If you see signs of a severe allergic reaction (hives, swelling of the face and throat, difficulty breathing, a fast heartbeat, dizziness, or weakness), call 9-1-1 and get the person to the nearest hospital. For other signs that concern you, call your health care provider. Adverse  reactions should be reported to the Vaccine Adverse Event Reporting System (VAERS). Your health care provider will usually file this report, or you can do it yourself. Visit the VAERS website at www.vaers.SamedayNews.es or call 916-445-9149. VAERS is only for reporting reactions, and VAERS staff members do not give medical advice. 6. The National Vaccine Injury Compensation Program The [employer] Vaccine Injury Compensation Program (VICP) is a federal program that was created to compensate people who may have been injured by certain vaccines. Claims regarding alleged injury or death due to vaccination have a time limit for filing, which may be as short as two years. Visit the VICP website at GoldCloset.com.ee or call 867-233-5799 to learn about the program and about filing a claim. 7. How can I learn more? Ask your health care provider. Call your local or state health department. Visit the website of the Food and Drug Administration (FDA) for vaccine package inserts and additional information at TraderRating.uy. Contact the Centers for Disease Control and Prevention (CDC): Call 6815990162 (1-800-CDC-INFO) or Visit CDC's website at https://gibson.com/. Vaccine Information Statement Inactivated Influenza Vaccine (02/13/2020)  This information is not intended to replace advice given to you by your health care provider. Make sure you discuss any questions you have with your health care provider. Document Revised: 04/01/2020 Document Reviewed: 04/01/2020 Elsevier Patient Education  2022 [employer].

## 2021-03-11 NOTE — Assessment & Plan Note (Signed)
Influenza vaccine provided today, other vaccines UTD. Pap smear UTD. Mammogram due in October, orders placed. Colonoscopy UTD, due 2026.  Commended her on maintaining her weight! Encouraged a healthy diet and exercise.  Exam today stable. Labs pending.

## 2021-03-11 NOTE — Assessment & Plan Note (Signed)
Well controlled on citalopram 20 mg.  Continue same.

## 2021-03-11 NOTE — Progress Notes (Signed)
Subjective:    Patient ID: Bianca Lynch, female    DOB: 09/23/1955, 65 y.o.   MRN: [id number]  HPI  Bianca Lynch is a very pleasant 65 y.o. female who presents today for complete physical and follow up of chronic conditions.  Immunizations: -Tetanus: 2015 -Influenza: Due today -Covid-19: 3 vaccines  -Shingles: Shingrix and Zostavax    Diet: Fair diet.  Exercise: No regular exercise.  Eye exam: Completes annually  Dental exam: Completes semi-annually   Pap Smear: Completed in 2020 per GYN Mammogram: Completed in October 2021 Colonoscopy: Completed in 2016, due 2026  BP Readings from Last 3 Encounters:  03/11/21 110/78  12/15/20 128/76  11/11/20 118/72    Wt Readings from Last 3 Encounters:  03/11/21 141 lb (64 kg)  12/15/20 143 lb 6.4 oz (65 kg)  11/11/20 142 lb 9.6 oz (64.7 kg)        Review of Systems  Constitutional:  Negative for unexpected weight change.  HENT:  Negative for rhinorrhea.   Eyes:  Negative for visual disturbance.  Respiratory:  Negative for cough and shortness of breath.   Cardiovascular:  Negative for chest pain.  Gastrointestinal:  Negative for constipation and diarrhea.  Genitourinary:  Negative for difficulty urinating.  Musculoskeletal:  Negative for arthralgias and myalgias.  Skin:  Negative for rash.  Allergic/Immunologic: Negative for environmental allergies.  Neurological:  Negative for dizziness, numbness and headaches.  Psychiatric/Behavioral:  The patient is not nervous/anxious.         Past Medical History:  Diagnosis Date   Allergy    seasonal   Chicken pox    Depression    Eczema    Elevated cholesterol    GERD (gastroesophageal reflux disease)    Hypertension    Insomnia    patient denies this at PAT appt 02/09/15   Meniere disease    Menopausal symptom    HOT FLASHES   Osteopenia    Thyroid disease    Hypothyroid    Social History   Socioeconomic History   Marital status: Married    Spouse  name: Not on file   Number of children: Not on file   Years of education: Not on file   Highest education level: Not on file  Occupational History   Not on file  Tobacco Use   Smoking status: Former    Packs/day: 1.00    Years: 15.00    Pack years: 15.00    Types: Cigarettes    Quit date: 11/09/1999    Years since quitting: 21.3   Smokeless tobacco: Never  Vaping Use   Vaping Use: Never used  Substance and Sexual Activity   Alcohol use: Yes    Alcohol/week: 14.0 standard drinks    Types: 14 Standard drinks or equivalent per week    Comment: 2 GLASSES OF WINE WITH DINNER    Drug use: No   Sexual activity: Not Currently    Birth control/protection: Post-menopausal    Comment: 1st intercourse-17, partners- 28, married- 34 yrs   Other Topics Concern   Not on file  Social History Narrative   Married.   No children.   Works as an Optometrist.   Enjoys bowling, play computer games, crossword puzzles   Social Determinants of Health   Financial Resource Strain: Not on file  Food Insecurity: Not on file  Transportation Needs: Not on file  Physical Activity: Not on file  Stress: Not on file  Social Connections: Not on file  Intimate  Partner Violence: Not on file    Past Surgical History:  Procedure Laterality Date   COLONOSCOPY     TUBAL LIGATION  11/22/1998   [facility]/DR. GOTTSE[facility]   WISDOM TOOTH EXTRACTION      Family History  Problem Relation Age of Onset   Hypertension Mother    Heart disease Mother    Cancer Mother        UTERINE   Heart disease Father    Breast cancer Paternal Aunt        age 23   Colon cancer Neg Hx     No Known Allergies  Current Outpatient Medications on File Prior to Visit  Medication Sig Dispense Refill   atorvastatin (LIPITOR) 40 MG tablet TAKE 1 TABLET BY MOUTH IN THE EVENING FOR CHOLESTEROL 30 tablet 0   Calcium Carb-Cholecalciferol (CALCIUM 600 + D PO) Take by mouth.     cetirizine (ZYRTEC) 10 MG tablet Take 10 mg by mouth daily.      citalopram (CELEXA) 20 MG tablet TAKE 1 TABLET (20 MG TOTAL) BY MOUTH DAILY. FOR DEPRESSION. 30 tablet 0   levothyroxine (SYNTHROID) 112 MCG tablet TAKE 1 TABLET BY MOUTH EVERY MORNING ON AN EMPTY STOMACH WITH A FULL GLASS OF WATER. NO OTHER MEDS OR FOOD FOR 30 MINUTES. 30 tablet 0   lisinopril (ZESTRIL) 10 MG tablet TAKE 1 TABLET BY MOUTH EVERY DAY FOR BLOOD PRESSURE 30 tablet 0   Multiple Vitamins-Minerals (CENTRUM SILVER PO) Take by mouth.     vitamin B-12 (CYANOCOBALAMIN) 250 MCG tablet Take 250 mcg by mouth daily. (Patient not taking: Reported on 03/11/2021)     No current facility-administered medications on file prior to visit.    BP 110/78   Pulse 82   Temp (!) 97.2 F (36.2 C) (Temporal)   Ht 5' 0.25" (1.53 m)   Wt 141 lb (64 kg)   SpO2 98%   BMI 27.31 kg/m  Objective:   Physical Exam HENT:     Right Ear: Tympanic membrane and ear canal normal.     Left Ear: Tympanic membrane and ear canal normal.     Nose: Nose normal.  Eyes:     Conjunctiva/sclera: Conjunctivae normal.     Pupils: Pupils are equal, round, and reactive to light.  Neck:     Thyroid: No thyromegaly.  Cardiovascular:     Rate and Rhythm: Normal rate and regular rhythm.     Heart sounds: No murmur heard. Pulmonary:     Effort: Pulmonary effort is normal.     Breath sounds: Normal breath sounds. No rales.  Abdominal:     General: Bowel sounds are normal.     Palpations: Abdomen is soft.     Tenderness: There is no abdominal tenderness.  Musculoskeletal:        General: Normal range of motion.     Cervical back: Neck supple.  Lymphadenopathy:     Cervical: No cervical adenopathy.  Skin:    General: Skin is warm and dry.     Findings: No rash.  Neurological:     Mental Status: She is alert and oriented to person, place, and time.     Cranial Nerves: No cranial nerve deficit.     Deep Tendon Reflexes: Reflexes are normal and symmetric.  Psychiatric:        Mood and Affect: Mood normal.           Assessment & Plan:      This visit occurred during  the SARS-CoV-2 public health emergency.  Safety protocols were in place, including screening questions prior to the visit, additional usage of staff PPE, and extensive cleaning of exam room while observing appropriate contact time as indicated for disinfecting solutions.

## 2021-03-11 NOTE — Assessment & Plan Note (Signed)
Well controlled in the office today. Continue lisinopril 10 mg, CMP pending.

## 2021-03-17 ENCOUNTER — Other Ambulatory Visit: Payer: Self-pay | Admitting: Primary Care

## 2021-03-17 DIAGNOSIS — E039 Hypothyroidism, unspecified: Secondary | ICD-10-CM

## 2021-03-25 ENCOUNTER — Telehealth: Payer: Self-pay | Admitting: Primary Care

## 2021-03-25 ENCOUNTER — Other Ambulatory Visit: Payer: Self-pay

## 2021-03-25 ENCOUNTER — Other Ambulatory Visit: Payer: 59

## 2021-03-25 ENCOUNTER — Other Ambulatory Visit (INDEPENDENT_AMBULATORY_CARE_PROVIDER_SITE_OTHER): Payer: 59

## 2021-03-25 ENCOUNTER — Other Ambulatory Visit: Payer: Self-pay | Admitting: Primary Care

## 2021-03-25 DIAGNOSIS — E039 Hypothyroidism, unspecified: Secondary | ICD-10-CM | POA: Diagnosis not present

## 2021-03-25 LAB — TSH: TSH: 0.14 u[IU]/mL — ABNORMAL LOW (ref 0.35–5.50)

## 2021-03-25 NOTE — Telephone Encounter (Signed)
Pt returning call. Would like a call back regarding lab results . [phone]

## 2021-03-29 NOTE — Telephone Encounter (Signed)
Per lab notes Bianca Lynch spoke with patient about her results

## 2021-05-10 ENCOUNTER — Other Ambulatory Visit: Payer: Self-pay

## 2021-05-10 ENCOUNTER — Ambulatory Visit
Admission: RE | Admit: 2021-05-10 | Discharge: 2021-05-10 | Disposition: A | Discharge status: Home / Self Care | Payer: Medicare Other | Source: Ambulatory Visit | Attending: Primary Care | Admitting: Primary Care

## 2021-05-10 DIAGNOSIS — Z1231 Encounter for screening mammogram for malignant neoplasm of breast: Secondary | ICD-10-CM

## 2021-06-23 ENCOUNTER — Other Ambulatory Visit: Payer: Self-pay

## 2021-06-23 ENCOUNTER — Other Ambulatory Visit (INDEPENDENT_AMBULATORY_CARE_PROVIDER_SITE_OTHER): Payer: Medicare Other

## 2021-06-23 DIAGNOSIS — E039 Hypothyroidism, unspecified: Secondary | ICD-10-CM | POA: Diagnosis not present

## 2021-06-23 LAB — TSH: TSH: 0.28 u[IU]/mL — ABNORMAL LOW (ref 0.35–5.50)

## 2021-06-23 LAB — T4, FREE: Free T4: 0.99 ng/dL (ref 0.60–1.60)

## 2021-08-15 ENCOUNTER — Other Ambulatory Visit: Payer: Self-pay | Admitting: Primary Care

## 2021-08-15 DIAGNOSIS — E039 Hypothyroidism, unspecified: Secondary | ICD-10-CM

## 2021-08-29 ENCOUNTER — Other Ambulatory Visit: Payer: Self-pay

## 2021-08-29 ENCOUNTER — Other Ambulatory Visit (INDEPENDENT_AMBULATORY_CARE_PROVIDER_SITE_OTHER): Payer: Medicare Other

## 2021-08-29 DIAGNOSIS — E039 Hypothyroidism, unspecified: Secondary | ICD-10-CM | POA: Diagnosis not present

## 2021-08-29 LAB — TSH: TSH: 0.83 u[IU]/mL (ref 0.35–5.50)

## 2021-08-30 DIAGNOSIS — E039 Hypothyroidism, unspecified: Secondary | ICD-10-CM

## 2021-09-22 ENCOUNTER — Emergency Department (HOSPITAL_COMMUNITY)
Admission: EM | Admit: 2021-09-22 | Discharge: 2021-09-22 | Disposition: A | Discharge status: Home / Self Care | Payer: Medicare Other | Attending: Student | Admitting: Student

## 2021-09-22 ENCOUNTER — Encounter (HOSPITAL_COMMUNITY): Payer: Self-pay | Admitting: Emergency Medicine

## 2021-09-22 ENCOUNTER — Emergency Department (HOSPITAL_COMMUNITY): Payer: Medicare Other

## 2021-09-22 DIAGNOSIS — I10 Essential (primary) hypertension: Secondary | ICD-10-CM | POA: Diagnosis not present

## 2021-09-22 DIAGNOSIS — R413 Other amnesia: Secondary | ICD-10-CM | POA: Diagnosis not present

## 2021-09-22 DIAGNOSIS — Z79899 Other long term (current) drug therapy: Secondary | ICD-10-CM | POA: Insufficient documentation

## 2021-09-22 NOTE — Discharge Instructions (Signed)
Your CT scan looked roughly this compared to previous.  Please follow-up with your primary regarding laboratory work-up, return to the ED if you have another episode like this. ?

## 2021-09-22 NOTE — ED Notes (Signed)
An After Visit Summary was printed and given to the patient. ?Discharge instructions given and no further questions at this time.  ?Pt A&Ox4 and ambulatory.  ?

## 2021-09-22 NOTE — Telephone Encounter (Signed)
I spoke wth pt; pt said on Monday morning her husband was asking her how she was feeling and how her memory was. Pt said fine and did not have a clue why he would ask her those type questions. Pts husband explained that on 09/18/21 pt asked several times what day it was and did not remember receiving a gift on Sunday and pt said she did not have any memory of Sunday. Pt has not had any H/A,CP,SOB or dizziness. Pt said she has been under stress. No available appts at [facility] today and pts husband is going to take pt to [facility] ED now for eval and possible testing. Sending note to Gentry Fitz NP and [facility] CMA. ?

## 2021-09-22 NOTE — ED Provider Notes (Addendum)
?[facility] DEPT ?Provider Note ? ? ?CSN: [id number] ?Arrival date & time: 09/22/21  1517 ? ?  ? ?History ? ?Chief Complaint  ?Patient presents with  ? Memory Loss  ? ? ?Bianca Lynch is a 66 y.o. female. ? ?HPI ? ?Patient is a 66 year old female presenting due to memory loss.  She had an episode Sunday evening when she had a few glasses of wine and kept asking her husband the same question 10 times.  She also did not remember who gave her a necklace although she does at baseline.  She has not had any symptoms since then, denies any unilateral symptoms.  Not having any changes in medicine, dysarthria, falls or injuries to her head.  Denies any difficulty walking, there is been no recurrence of symptoms but her husband wanted her to get checked out.  Patient does report her husband is undergoing treatment for cancer and she has been under increased stress.  She typically drinks 1 or 2 glasses of wine nightly. ? ?Past medical history is notable for history of cigarette use, hypertension, hypothyroid. ? ?Home Medications ?Prior to Admission medications   ?Medication Sig Start Date End Date Taking? Authorizing Provider  ?atorvastatin (LIPITOR) 40 MG tablet Take 1 tablet (40 mg total) by mouth daily. For cholesterol 03/11/21   Pleas Koch, NP  ?Calcium Carb-Cholecalciferol (CALCIUM 600 + D PO) Take by mouth.    [provider]  ?cetirizine (ZYRTEC) 10 MG tablet Take 10 mg by mouth daily.    [provider]  ?citalopram (CELEXA) 20 MG tablet Take 1 tablet (20 mg total) by mouth daily. For depression. 03/11/21   Pleas Koch, NP  ?levothyroxine (SYNTHROID) 112 MCG tablet Take 1 tablet by mouth every morning on an empty stomach with water only.  No food or other medications for 30 minutes. 03/11/21   Pleas Koch, NP  ?lisinopril (ZESTRIL) 10 MG tablet TAKE 1 TABLET BY MOUTH EVERY DAY FOR BLOOD PRESSURE 03/11/21   Pleas Koch, NP  ?Multiple  Vitamins-Minerals (CENTRUM SILVER PO) Take by mouth.    [provider]  ?vitamin B-12 (CYANOCOBALAMIN) 250 MCG tablet Take 250 mcg by mouth daily. ?Patient not taking: Reported on 03/11/2021    [provider]  ?   ? ?Allergies    ?Patient has no known allergies.   ? ?Review of Systems   ?Review of Systems ? ?Physical Exam ?Updated Vital Signs ?BP (!) 141/95   Pulse 70   Temp 98 ?F (36.7 ?C) (Oral)   Resp 18   SpO2 98%  ?Physical Exam ?Vitals and nursing note reviewed. Exam conducted with a chaperone present.  ?Constitutional:   ?   General: She is not in acute distress. ?   Appearance: Normal appearance.  ?HENT:  ?   Head: Normocephalic and atraumatic.  ?Eyes:  ?   General: No scleral icterus.    ?   Right eye: No discharge.     ?   Left eye: No discharge.  ?   Extraocular Movements: Extraocular movements intact.  ?   Pupils: Pupils are equal, round, and reactive to light.  ?Cardiovascular:  ?   Rate and Rhythm: Normal rate and regular rhythm.  ?   Pulses: Normal pulses.  ?   Heart sounds: Normal heart sounds. No murmur heard. ?  No friction rub. No gallop.  ?Pulmonary:  ?   Effort: Pulmonary effort is normal. No respiratory distress.  ?   Breath  sounds: Normal breath sounds.  ?Abdominal:  ?   General: Abdomen is flat. Bowel sounds are normal. There is no distension.  ?   Palpations: Abdomen is soft.  ?   Tenderness: There is no abdominal tenderness.  ?Skin: ?   General: Skin is warm and dry.  ?   Coloration: Skin is not jaundiced.  ?Neurological:  ?   Mental Status: She is alert. Mental status is at baseline.  ?   Coordination: Coordination normal.  ?   Comments: Cranial nerves II through XII are grossly intact.  Grip strength is equal bilaterally, finger-to-nose and pronator drift within normal limits.  Able to do heel-to-shin, ambulatory with steady gait.  Sensation to light touch is grossly intact.  ? ? ?ED Results / Procedures / Treatments   ?Labs ?(all labs ordered are listed, but only  abnormal results are displayed) ?Labs Reviewed - No data to display ? ?EKG ?None ? ?Radiology ?CT Head Wo Contrast ? ?Result Date: 09/22/2021 ?CLINICAL DATA:  Mental status change, unknown cause EXAM: CT HEAD WITHOUT CONTRAST TECHNIQUE: Contiguous axial images were obtained from the base of the skull through the vertex without intravenous contrast. RADIATION DOSE REDUCTION: This exam was performed according to the departmental dose-optimization program which includes automated exposure control, adjustment of the mA and/or kV according to patient size and/or use of iterative reconstruction technique. COMPARISON:  MRI head September 24, 2008. FINDINGS: Brain: No evidence of acute large vascular territory infarction, hemorrhage, hydrocephalus, extra-axial collection or mass lesion/mass effect. Probable remote infarct in the subcortical left parietal lobe, similar across modalities to 2010 MRI. Vascular: No hyperdense vessel identified. Skull: No acute fracture. Sinuses/Orbits: Clear sinuses.  Visualized orbits are unremarkable. Other: No mastoid effusions. IMPRESSION: 1. No evidence of acute intracranial abnormality. 2. Probable remote infarct in the subcortical left parietal lobe, similar across modalities to 2010 MRI. Electronically Signed   By: Margaretha Sheffield M.D.   On: 09/22/2021 16:16   ? ?Procedures ?Procedures  ? ? ?Medications Ordered in ED ?Medications - No data to display ? ?ED Course/ Medical Decision Making/ A&P ?  ?                        ?Medical Decision Making ?Amount and/or Complexity of Data Reviewed ?Radiology: ordered. ? ? ?This patient presents to the ED for concern of memory issue, this involves an extensive number of treatment options, and is a complaint that carries with it a high risk of complications and morbidity.  The differential diagnosis includes TIA, CVA, early dementia, alcohol consumption, other ? ? ?Additional history obtained:  ? ?Independent historian: N/A ? ?I reviewed primary note,  she is not on any blood thinners.  No history of dementia.  She is on medication for hypertension, she is not having any chest pain or shortness of breath. ? ?Reviewed telephone encounter from her primary nurse practitioner Loma Boston who advised to go to ED for possible TIA work-up from the voicemail. ? ?   ?Imaging Studies ordered: ? ?I directly visualized the CT Head, which showed parietal abnormality which has been present since MRI in 2010 ? ?I agree with the radiologist interpretation ? ? ?Test Considered: ?I did consider MRI and possible TIA work-up with labs.  However, patient is not having any focal deficits on exam.  There is no lateralizing symptoms, the story is also a bit vague involving repeating the same question in the context of drinking alcohol.  Engaged in shared  decision making the patient and I and ultimately do not feel she needs emergent TIA work-up at this time.  I encouraged her to follow-up with her primary ? ?  ?Consultations Obtained: ? ?I sent an in basket message to Loma Boston nurse practitioner who follows with this patient.  Gave her the result of the CT head as well as her history and what my plan was for her involving close follow-up in the outpatient setting.  Do not expect reply given it is evening but hopefully this helps facilitate continuity of care. ? ? ?Reevaluation: ? ?After the interventions noted above, I reevaluated the patient and found unchanged ? ? ?Problems addressed / ED Course: ?Patient is a 66 year old presenting due to episode of transient memory loss while drinking alcohol at home.  On exam she has no focal neurodeficits, she is oriented x3.  Is been no repeat episodes of this transient loss of memory, she denies any falls, medication changes, does not appear to be consistent with delirium.  She additionally does not have any infectious symptoms, no other physical symptoms.  Although I did consider getting labs, I think CT head is likely sufficient.   Consider TIA/CVA, patient is outside of the window of stroke.  Additionally, given no focal deficits does not meet criteria for stroke.  TIA possible but ultimately I feel the alcohol use and stress are more likely the etiol

## 2021-09-22 NOTE — ED Triage Notes (Signed)
Per pt, states her husband was concerned about an incident on Sunday where she was having a drink of wine and her husband asked her what day it was and she could not recall.  He also asked about a present that was given to her and she denied it coming from her niece and in fact it did.  States she is under a lot of stress with job and her husband who has cancer.  Also states she has not gotten use to the time change yet-states she woke up on Monday fine, no memory issues with orientation although she could not remember incident with her husband that occurred on Sunday ?

## 2021-09-22 NOTE — Telephone Encounter (Signed)
Agree that patient needs additional work-up as this sounds like it could have been a TIA.  I am happy to evaluate her at her earliest convenience.  Will await ED notes. ?

## 2021-11-08 ENCOUNTER — Telehealth: Payer: Self-pay | Admitting: Pulmonary Disease

## 2021-11-08 DIAGNOSIS — Z9989 Dependence on other enabling machines and devices: Secondary | ICD-10-CM

## 2021-11-09 NOTE — Telephone Encounter (Signed)
Called and spoke with patient. She stated that she had been trying to get her cpap supplies from [employer] but they have not received a response from our office. I advised her that I would send a message to AO to see if he is ok with [place] sending the request to [employer]. She verbalized understanding.  ? ?AO, please advise if you are ok with [place] placing an order for her cpap supplies. Thanks!  ?

## 2021-11-10 NOTE — Telephone Encounter (Signed)
Order placed to [employer] for cpap supllies. ?Patient is aware and voiced her understanding.  ?Nothing further needed.  ? ?

## 2021-11-10 NOTE — Telephone Encounter (Signed)
Okay to send script for Cpap supplies to [employer] ?

## 2021-12-16 ENCOUNTER — Encounter: Payer: Self-pay | Admitting: Obstetrics & Gynecology

## 2021-12-16 ENCOUNTER — Ambulatory Visit (INDEPENDENT_AMBULATORY_CARE_PROVIDER_SITE_OTHER): Payer: Medicare Other | Admitting: Obstetrics & Gynecology

## 2021-12-16 VITALS — BP 120/78 | HR 75 | Ht 60.25 in | Wt 146.0 lb

## 2021-12-16 DIAGNOSIS — Z9189 Other specified personal risk factors, not elsewhere classified: Secondary | ICD-10-CM

## 2021-12-16 DIAGNOSIS — Z780 Asymptomatic menopausal state: Secondary | ICD-10-CM

## 2021-12-16 DIAGNOSIS — Z01419 Encounter for gynecological examination (general) (routine) without abnormal findings: Secondary | ICD-10-CM

## 2021-12-16 NOTE — Progress Notes (Signed)
Bianca Lynch Sep 23, 1955 [id number]   History:    66 y.o. G0 Married   RP:  Established patient presenting for annual gyn exam    HPI: Postmenopause, well on no HRT.  No PMB.  No pelvic pain.  Abstinent.  Pap Neg 11/2018. No h/o abnormal Pap.  Will repeat Pap at 5 years.  Urine/BMs normal.  Breasts normal.  Screening mammo neg 05/2021.  BMI 28.28. Walking.  Health Labs with [facility] NP.  Colonoscopy 02/2015.  Bone Density normal in 02/2019.   Past medical history,surgical history, family history and social history were all reviewed and documented in the EPIC chart.  Gynecologic History No LMP recorded. Patient is postmenopausal.  Obstetric History OB History  Gravida Para Term Preterm AB Living  0            SAB IAB Ectopic Multiple Live Births                ROS: A ROS was performed and pertinent positives and negatives are included in the history. GENERAL: No fevers or chills. HEENT: No change in vision, no earache, sore throat or sinus congestion. NECK: No pain or stiffness. CARDIOVASCULAR: No chest pain or pressure. No palpitations. PULMONARY: No shortness of breath, cough or wheeze. GASTROINTESTINAL: No abdominal pain, nausea, vomiting or diarrhea, melena or bright red blood per rectum. GENITOURINARY: No urinary frequency, urgency, hesitancy or dysuria. MUSCULOSKELETAL: No joint or muscle pain, no back pain, no recent trauma. DERMATOLOGIC: No rash, no itching, no lesions. ENDOCRINE: No polyuria, polydipsia, no heat or cold intolerance. No recent change in weight. HEMATOLOGICAL: No anemia or easy bruising or bleeding. NEUROLOGIC: No headache, seizures, numbness, tingling or weakness. PSYCHIATRIC: No depression, no loss of interest in normal activity or change in sleep pattern.     Exam:   BP 120/78   Pulse 75   Ht 5' 0.25" (1.53 m)   Wt 146 lb (66.2 kg)   SpO2 99%   BMI 28.28 kg/m   Body mass index is 28.28 kg/m.  General appearance : Well developed well nourished female.  No acute distress HEENT: Eyes: no retinal hemorrhage or exudates,  Neck supple, trachea midline, no carotid bruits, no thyroidmegaly Lungs: Clear to auscultation, no rhonchi or wheezes, or rib retractions  Heart: Regular rate and rhythm, no murmurs or gallops Breast:Examined in sitting and supine position were symmetrical in appearance, no palpable masses or tenderness,  no skin retraction, no nipple inversion, no nipple discharge, no skin discoloration, no axillary or supraclavicular lymphadenopathy Abdomen: no palpable masses or tenderness, no rebound or guarding Extremities: no edema or skin discoloration or tenderness  Pelvic: Vulva: Normal             Vagina: No gross lesions or discharge  Cervix: No gross lesions or discharge  Uterus  AV, normal size, shape and consistency, non-tender and mobile  Adnexa  Without masses or tenderness  Anus: Normal  Assessment/Plan:  66 y.o. female for annual exam   1. Well female exam with routine gynecological exam Postmenopause, well on no HRT.  No PMB.  No pelvic pain.  Abstinent.  Pap Neg 11/2018. No h/o abnormal Pap.  Will repeat Pap at 5 years.  Urine/BMs normal.  Breasts normal.  Screening mammo neg 05/2021.  BMI 28.28. Walking.  [facility] with [facility] NP.  Colonoscopy 02/2015.  Bone Density normal in 02/2019.  2. Postmenopause Postmenopause, well on no HRT.  No PMB.  No pelvic pain.  Abstinent.  3.  Other specified personal risk factors, not elsewhere classified   Princess Bruins MD, 8:58 AM 12/16/2021

## 2022-01-24 ENCOUNTER — Ambulatory Visit (INDEPENDENT_AMBULATORY_CARE_PROVIDER_SITE_OTHER): Payer: Medicare Other | Admitting: Primary Care

## 2022-01-24 ENCOUNTER — Encounter: Payer: Self-pay | Admitting: Primary Care

## 2022-01-24 VITALS — BP 122/78 | HR 61 | Temp 98.7°F | Ht 60.0 in | Wt 150.0 lb

## 2022-01-24 DIAGNOSIS — G4733 Obstructive sleep apnea (adult) (pediatric): Secondary | ICD-10-CM | POA: Diagnosis not present

## 2022-01-24 DIAGNOSIS — Z9989 Dependence on other enabling machines and devices: Secondary | ICD-10-CM

## 2022-01-24 NOTE — Patient Instructions (Addendum)
Very nice to see Bianca Lynch, glad you are doing so well  Sleep apnea appears well controlled on current CPAP setting, if in the future you would like to see if you could come off CPAP we would need to repeat sleep study d/t weight loss   Recommendations Continue to wear CPAP every night for minimum of 4 to 6 hours or longer Do not drive experiencing excessive daytime sleepiness or fatigue Avoid back sleeping position  Orders Renew CPAP supplies with [employer] (mask, tubing, water chamber and filters)  Follow-up 1 year follow-up with either Dr. Ander Slade or Eustaquio Maize NP  CPAP and BIPAP Information CPAP and BIPAP are methods that use air pressure to keep your airways open and to help you breathe well. CPAP and BIPAP use different amounts of pressure. Your health care provider will tell you whether CPAP or BIPAP would be more helpful for you. CPAP stands for "continuous positive airway pressure." With CPAP, the amount of pressure stays the same while you breathe in (inhale) and out (exhale). BIPAP stands for "bi-level positive airway pressure." With BIPAP, the amount of pressure will be higher when you inhale and lower when you exhale. This allows you to take larger breaths. CPAP or BIPAP may be used in the hospital, or your health care provider may want you to use it at home. You may need to have a sleep study before your health care provider can order a machine for you to use at home. What are the advantages? CPAP or BIPAP can be helpful if you have: Sleep apnea. Chronic obstructive pulmonary disease (COPD). Heart failure. Medical conditions that cause muscle weakness, including muscular dystrophy or amyotrophic lateral sclerosis (ALS). Other problems that cause breathing to be shallow, weak, abnormal, or difficult. CPAP and BIPAP are most commonly used for obstructive sleep apnea (OSA) to keep the airways from collapsing when the muscles relax during sleep. What are the risks? Generally, this is a  safe treatment. However, problems may occur, including: Irritated skin or skin sores if the mask does not fit properly. Dry or stuffy nose or nosebleeds. Dry mouth. Feeling gassy or bloated. Sinus or lung infection if the equipment is not cleaned properly. When should CPAP or BIPAP be used? In most cases, the mask only needs to be worn during sleep. Generally, the mask needs to be worn throughout the night and during any daytime naps. People with certain medical conditions may also need to wear the mask at other times, such as when they are awake. Follow instructions from your health care provider about when to use the machine. What happens during CPAP or BIPAP?  Both CPAP and BIPAP are provided by a small machine with a flexible plastic tube that attaches to a plastic mask that you wear. Air is blown through the mask into your nose or mouth. The amount of pressure that is used to blow the air can be adjusted on the machine. Your health care provider will set the pressure setting and help you find the best mask for you. Tips for using the mask Because the mask needs to be snug, some people feel trapped or closed-in (claustrophobic) when first using the mask. If you feel this way, you may need to get used to the mask. One way to do this is to hold the mask loosely over your nose or mouth and then gradually apply the mask more snugly. You can also gradually increase the amount of time that you use the mask. Masks are available  in various types and sizes. If your mask does not fit well, talk with your health care provider about getting a different one. Some common types of masks include: Full face masks, which fit over the mouth and nose. Nasal masks, which fit over the nose. Nasal pillow or prong masks, which fit into the nostrils. If you are using a mask that fits over your nose and you tend to breathe through your mouth, a chin strap may be applied to help keep your mouth closed. Use a skin barrier  to protect your skin as told by your health care provider. Some CPAP and BIPAP machines have alarms that may sound if the mask comes off or develops a leak. If you have trouble with the mask, it is very important that you talk with your health care provider about finding a way to make the mask easier to tolerate. Do not stop using the mask. There could be a negative impact on your health if you stop using the mask. Tips for using the machine Place your CPAP or BIPAP machine on a secure table or stand near an electrical outlet. Know where the on/off switch is on the machine. Follow instructions from your health care provider about how to set the pressure on your machine and when you should use it. Do not eat or drink while the CPAP or BIPAP machine is on. Food or fluids could get pushed into your lungs by the pressure of the CPAP or BIPAP. For home use, CPAP and BIPAP machines can be rented or purchased through home health care companies. Many different brands of machines are available. Renting a machine before purchasing may help you find out which particular machine works well for you. Your health insurance company may also decide which machine you may get. Keep the CPAP or BIPAP machine and attachments clean. Ask your health care provider for specific instructions. Check the humidifier if you have a dry stuffy nose or nosebleeds. Make sure it is working correctly. Follow these instructions at home: Take over-the-counter and prescription medicines only as told by your health care provider. Ask if you can take sinus medicine if your sinuses are blocked. Do not use any products that contain nicotine or tobacco. These products include cigarettes, chewing tobacco, and vaping devices, such as e-cigarettes. If you need help quitting, ask your health care provider. Keep all follow-up visits. This is important. Contact a health care provider if: You have redness or pressure sores on your head, face, mouth,  or nose from the mask or head gear. You have trouble using the CPAP or BIPAP machine. You cannot tolerate wearing the CPAP or BIPAP mask. Someone tells you that you snore even when wearing your CPAP or BIPAP. Get help right away if: You have trouble breathing. You feel confused. Summary CPAP and BIPAP are methods that use air pressure to keep your airways open and to help you breathe well. If you have trouble with the mask, it is very important that you talk with your health care provider about finding a way to make the mask easier to tolerate. Do not stop using the mask. There could be a negative impact to your health if you stop using the mask. Follow instructions from your health care provider about when to use the machine. This information is not intended to replace advice given to you by your health care provider. Make sure you discuss any questions you have with your health care provider. Document Revised: 02/02/2021 Document Reviewed:  06/04/2020 Elsevier Patient Education  [place].

## 2022-01-24 NOTE — Progress Notes (Signed)
$'@Patient'U$  ID: Bianca Lynch, female    DOB: [birth year], 66 y.o.   MRN: [id number]  Chief Complaint  Patient presents with   Follow-up    No new issues or concerns    Referring provider: Pleas Koch, NP  HPI: 66 year old female, former smoker. PMH significant for HTN, thyroid disease, osteopenia, prediabetes, depression, insomnia. Patient of Dr. Ander Slade, seen on 01/15/19 for snoring. HST 02/03/19 showed mild obstructive sleep apnea, AHI 9.1/hour.   Previous [facility] pulmonary encounter: 11/11/20- Dr. Ander Slade Diagnosed with mild obstructive sleep apnea Has been on CPAP therapy She is compliant with CPAP use Feels better overall  Continues to lose weight  Sleeps well at night, functions well during the day  01/24/2022- Interim hx  Patient presents today for annual follow-up for OSA. She is doing very well. Reports significant improvement in sleep and quality of life since starting CPAP. She is sleeping well at night without significant daytime sleepiness. She has lost 40 lbs since her sleep study in 2020. She is compliant with CPAP use. Current pressure setting auto 5-15cm h20, no significant residual apneas were noted on download. She needs CPAP supplies renewed. She uses full face mask. DME company is [employer].   Airview download 12/25/21-01/23/22 Usage 25/30 days (83%); 25 days(83%) > 4 hours Pressure 5-15cm h20 (10.6cm h20-95%) Airleaks 10.6L/min (95%) AHI 1.5    No Known Allergies  Immunization History  Administered Date(s) Administered   Influenza,inj,Quad PF,6+ Mos 04/12/2017, 04/06/2018, 04/20/2019, 05/05/2020, 03/11/2021   Influenza-Unspecified 04/12/2017, 04/20/2019   Moderna Sars-Covid-2 Vaccination 09/26/2019, 10/27/2019, 06/20/2020   Tdap 11/11/2013   Zoster Recombinat (Shingrix) 04/12/2017, 09/04/2017   Zoster, Live 10/14/2012    Past Medical History:  Diagnosis Date   Allergy    seasonal   Chicken pox    Depression    Eczema    Elevated cholesterol    GERD  (gastroesophageal reflux disease)    Hypertension    Insomnia    patient denies this at PAT appt 02/09/15   Meniere disease    Menopausal symptom    HOT FLASHES   Osteopenia    Thyroid disease    Hypothyroid    Tobacco History: Social History   Tobacco Use  Smoking Status Former   Packs/day: 1.00   Years: 15.00   Total pack years: 15.00   Types: Cigarettes   Quit date: 11/09/1999   Years since quitting: 22.2   Passive exposure: Past  Smokeless Tobacco Never   Counseling given: Not Answered   Outpatient Medications Prior to Visit  Medication Sig Dispense Refill   atorvastatin (LIPITOR) 40 MG tablet Take 1 tablet (40 mg total) by mouth daily. For cholesterol 90 tablet 3   Calcium Carb-Cholecalciferol (CALCIUM 600 + D PO) Take by mouth.     cetirizine (ZYRTEC) 10 MG tablet Take 10 mg by mouth daily.     citalopram (CELEXA) 20 MG tablet Take 1 tablet (20 mg total) by mouth daily. For depression. 90 tablet 3   levothyroxine (SYNTHROID) 112 MCG tablet Take 1 tablet by mouth every morning on an empty stomach with water only.  No food or other medications for 30 minutes. 90 tablet 3   lisinopril (ZESTRIL) 10 MG tablet TAKE 1 TABLET BY MOUTH EVERY DAY FOR BLOOD PRESSURE 90 tablet 3   Multiple Vitamins-Minerals (CENTRUM SILVER PO) Take by mouth.     No facility-administered medications prior to visit.   Review of Systems  Review of Systems  Constitutional: Negative.  HENT: Negative.    Respiratory: Negative.    Cardiovascular: Negative.    Physical Exam  BP 122/78 (BP Location: Left Arm, Patient Position: Sitting, Cuff Size: Normal)   Pulse 61   Temp 98.7 F (37.1 C) (Oral)   Ht 5' (1.524 m)   Wt 150 lb (68 kg)   SpO2 97%   BMI 29.29 kg/m  Physical Exam Constitutional:      Appearance: Normal appearance.  HENT:     Head: Normocephalic and atraumatic.     Mouth/Throat:     Mouth: Mucous membranes are moist.     Pharynx: Oropharynx is clear.  Cardiovascular:      Rate and Rhythm: Normal rate and regular rhythm.  Pulmonary:     Effort: Pulmonary effort is normal.     Breath sounds: Normal breath sounds.  Skin:    General: Skin is warm and dry.  Neurological:     General: No focal deficit present.     Mental Status: She is alert and oriented to person, place, and time. Mental status is at baseline.  Psychiatric:        Mood and Affect: Mood normal.        Behavior: Behavior normal.        Thought Content: Thought content normal.        Judgment: Judgment normal.      Lab Results:  CBC    Component Value Date/Time   WBC 5.0 03/11/2021 0956   RBC 4.04 03/11/2021 0956   HGB 12.9 03/11/2021 0956   HCT 38.9 03/11/2021 0956   PLT 241.0 03/11/2021 0956   MCV 96.5 03/11/2021 0956   MCHC 33.2 03/11/2021 0956   RDW 12.6 03/11/2021 0956    BMET    Component Value Date/Time   NA 140 03/11/2021 0956   K 4.4 03/11/2021 0956   CL 103 03/11/2021 0956   CO2 32 03/11/2021 0956   GLUCOSE 74 03/11/2021 0956   BUN 15 03/11/2021 0956   CREATININE 0.76 03/11/2021 0956   CALCIUM 9.5 03/11/2021 0956    BNP No results found for: "BNP"  ProBNP No results found for: "PROBNP"  Imaging: No results found.   Assessment & Plan:   OSA (obstructive sleep apnea) - Stable/well controlled on CPAP. HST 01/2019 showed mild OSA, AHI 9.1/hour. Patient was started on auto CPAP and remains compliant with use. Reports significant improvement in sleep quality and energy level. She has lost 40 lbs. Current pressure 5-15cm h20 (10.6cm h20-95%); Residual AHI 1.5/hr. At some point we may want to consider repeating sleep study to re-assess OSA d/t weight loss. Since she is benefiting from CPAP use recommend continuing therapy. No changes needed to pressure setting. Renewing CPAP supplies with [employer]. FU in 1 year or sooner if needed.    Martyn Ehrich, NP 01/24/2022

## 2022-01-24 NOTE — Assessment & Plan Note (Signed)
-   Stable/well controlled on CPAP. HST 01/2019 showed mild OSA, AHI 9.1/hour. Patient was started on auto CPAP and remains compliant with use. Reports significant improvement in sleep quality and energy level. She has lost 40 lbs. Current pressure 5-15cm h20 (10.6cm h20-95%); Residual AHI 1.5/hr. At some point we may want to consider repeating sleep study to re-assess OSA d/t weight loss. Since she is benefiting from CPAP use recommend continuing therapy. No changes needed to pressure setting. Renewing CPAP supplies with [employer]. FU in 1 year or sooner if needed.

## 2022-02-12 ENCOUNTER — Other Ambulatory Visit: Payer: Self-pay

## 2022-02-12 ENCOUNTER — Emergency Department (HOSPITAL_COMMUNITY)
Admission: EM | Admit: 2022-02-12 | Discharge: 2022-02-12 | Disposition: A | Discharge status: Home / Self Care | Payer: Medicare Other | Attending: Emergency Medicine | Admitting: Emergency Medicine

## 2022-02-12 ENCOUNTER — Encounter (HOSPITAL_COMMUNITY): Payer: Self-pay

## 2022-02-12 DIAGNOSIS — I10 Essential (primary) hypertension: Secondary | ICD-10-CM | POA: Insufficient documentation

## 2022-02-12 DIAGNOSIS — R39198 Other difficulties with micturition: Secondary | ICD-10-CM | POA: Insufficient documentation

## 2022-02-12 DIAGNOSIS — Z79899 Other long term (current) drug therapy: Secondary | ICD-10-CM | POA: Diagnosis not present

## 2022-02-12 DIAGNOSIS — N390 Urinary tract infection, site not specified: Secondary | ICD-10-CM

## 2022-02-12 DIAGNOSIS — R339 Retention of urine, unspecified: Secondary | ICD-10-CM | POA: Insufficient documentation

## 2022-02-12 LAB — CBC WITH DIFFERENTIAL/PLATELET
Abs Immature Granulocytes: 0.04 10*3/uL (ref 0.00–0.07)
Basophils Absolute: 0 10*3/uL (ref 0.0–0.1)
Basophils Relative: 0 %
Eosinophils Absolute: 0.1 10*3/uL (ref 0.0–0.5)
Eosinophils Relative: 1 %
HCT: 39.1 % (ref 36.0–46.0)
Hemoglobin: 13.3 g/dL (ref 12.0–15.0)
Immature Granulocytes: 0 %
Lymphocytes Relative: 16 %
Lymphs Abs: 2.1 10*3/uL (ref 0.7–4.0)
MCH: 32.9 pg (ref 26.0–34.0)
MCHC: 34 g/dL (ref 30.0–36.0)
MCV: 96.8 fL (ref 80.0–100.0)
Monocytes Absolute: 0.7 10*3/uL (ref 0.1–1.0)
Monocytes Relative: 6 %
Neutro Abs: 9.8 10*3/uL — ABNORMAL HIGH (ref 1.7–7.7)
Neutrophils Relative %: 77 %
Platelets: 266 10*3/uL (ref 150–400)
RBC: 4.04 MIL/uL (ref 3.87–5.11)
RDW: 12.8 % (ref 11.5–15.5)
WBC: 12.8 10*3/uL — ABNORMAL HIGH (ref 4.0–10.5)
nRBC: 0 % (ref 0.0–0.2)

## 2022-02-12 LAB — URINALYSIS, ROUTINE W REFLEX MICROSCOPIC
Bilirubin Urine: NEGATIVE
Glucose, UA: NEGATIVE mg/dL
Ketones, ur: NEGATIVE mg/dL
Nitrite: NEGATIVE
Specific Gravity, Urine: <1.005 — ABNORMAL LOW (ref 1.005–1.030)
pH: 6.5 (ref 5.0–8.0)

## 2022-02-12 LAB — BASIC METABOLIC PANEL
Anion gap: 8 (ref 5–15)
BUN: 15 mg/dL (ref 8–23)
CO2: 27 mmol/L (ref 22–32)
Calcium: 9.1 mg/dL (ref 8.9–10.3)
Chloride: 95 mmol/L — ABNORMAL LOW (ref 98–111)
Creatinine, Ser: 0.82 mg/dL (ref 0.44–1.00)
GFR, Estimated: >60 mL/min (ref >60)
Glucose, Bld: 99 mg/dL (ref 70–99)
Potassium: 4.2 mmol/L (ref 3.5–5.1)
Sodium: 130 mmol/L — ABNORMAL LOW (ref 135–145)

## 2022-02-12 LAB — URINALYSIS, MICROSCOPIC (REFLEX)
RBC / HPF: >50 RBC/hpf (ref 0–5)
WBC, UA: >50 WBC/hpf (ref 0–5)

## 2022-02-12 MED ORDER — CEPHALEXIN 500 MG PO CAPS: 500.0000 mg | ORAL_CAPSULE | Freq: Two times a day (BID) | ORAL | 0 refills | Status: AC

## 2022-02-12 MED ORDER — CEPHALEXIN 500 MG PO CAPS
500.0000 mg | ORAL_CAPSULE | Freq: Once | ORAL | Status: AC
  Administered 2022-02-12: 500 mg via ORAL
  Filled 2022-02-12: qty 1

## 2022-02-12 NOTE — ED Provider Triage Note (Signed)
Emergency Medicine Provider Triage Evaluation Note  Bianca Lynch , a 66 y.o. female  was evaluated in triage.  Pt complains of urinary change.  She reports that yesterday she was at a golf tournament yesterday, felt dehydrated, she has been drinking a lot of water she is unsure if this is a uti but doesn't think so.   She did have urinary incontinence in the department prior to [facility]  No back pain, no new leg numbness or weakness.   Physical Exam  BP (!) 190/81 (BP Location: Left Arm)   Pulse 64   Temp 98.2 F (36.8 C) (Oral)   Resp 16   Ht 5' (1.524 m)   Wt 68 kg   SpO2 98%   BMI 29.29 kg/m  Gen:   Awake, no distress   Resp:  Normal effort  MSK:   Moves extremities without difficulty  Other:  Normal speech  Medical Decision Making  Medically screening exam initiated at 7:43 PM.  Appropriate orders placed.  Mar Daring Choate was informed that the remainder of the evaluation will be completed by another provider, this initial triage assessment does not replace that evaluation, and the importance of remaining in the ED until their evaluation is complete.     Lorin Glass, [place] 02/12/22 1945

## 2022-02-12 NOTE — ED Provider Notes (Signed)
[facility] DEPT Provider Note   CSN: [id number] Arrival date & time: 02/12/22  1810     History  Chief Complaint  Patient presents with   Urinary Retention    Bianca Lynch is a 66 y.o. female.  Patient is a 66 year old female with a past medical history of hypertension, hyperlipidemia and prediabetes presenting to the emergency department with difficulty with urination.  The patient states that she was out [place] the [place] yesterday in the heat and felt dehydrated.  She states that she tried to drink more water today but only urinated a little bit and felt like she could not empty her bladder.  She states that while in the triage room today she suddenly urinated on herself.  She states that she is since urinated normally.  She denies any dysuria or hematuria but states that she has had urinary frequency and urgency.  She denies any flank pain, fevers or chills, nausea or vomiting.  She states this is never happened to her before.  The history is provided by the patient.       Home Medications Prior to Admission medications   Medication Sig Start Date End Date Taking? Authorizing Provider  atorvastatin (LIPITOR) 40 MG tablet Take 1 tablet (40 mg total) by mouth daily. For cholesterol 03/11/21   Pleas Koch, NP  Calcium Carb-Cholecalciferol (CALCIUM 600 + D PO) Take by mouth.    [provider]  cetirizine (ZYRTEC) 10 MG tablet Take 10 mg by mouth daily.    [provider]  citalopram (CELEXA) 20 MG tablet Take 1 tablet (20 mg total) by mouth daily. For depression. 03/11/21   Pleas Koch, NP  levothyroxine (SYNTHROID) 112 MCG tablet Take 1 tablet by mouth every morning on an empty stomach with water only.  No food or other medications for 30 minutes. 03/11/21   Pleas Koch, NP  lisinopril (ZESTRIL) 10 MG tablet TAKE 1 TABLET BY MOUTH EVERY DAY FOR BLOOD PRESSURE 03/11/21   Pleas Koch, NP  Multiple  Vitamins-Minerals (CENTRUM SILVER PO) Take by mouth.    [provider]      Allergies    Patient has no known allergies.    Review of Systems   Review of Systems  Physical Exam Updated Vital Signs BP (!) 147/83   Pulse (!) 57   Temp 98.2 F (36.8 C) (Oral)   Resp 17   Ht 5' (1.524 m)   Wt 68 kg   SpO2 99%   BMI 29.29 kg/m  Physical Exam Vitals reviewed.  Constitutional:      General: She is not in acute distress.    Appearance: Normal appearance.  HENT:     Head: Normocephalic and atraumatic.     Mouth/Throat:     Mouth: Mucous membranes are moist.  Eyes:     Extraocular Movements: Extraocular movements intact.     Conjunctiva/sclera: Conjunctivae normal.  Cardiovascular:     Rate and Rhythm: Normal rate and regular rhythm.  Pulmonary:     Effort: Pulmonary effort is normal.     Breath sounds: Normal breath sounds.  Abdominal:     General: Abdomen is flat.     Palpations: Abdomen is soft.     Tenderness: There is no right CVA tenderness or left CVA tenderness.  Musculoskeletal:        General: Normal range of motion.     Cervical back: Normal range of motion and neck supple.  Skin:    General: Skin is warm and dry.  Neurological:     General: No focal deficit present.     Mental Status: She is alert and oriented to person, place, and time.  Psychiatric:        Mood and Affect: Mood normal.        Behavior: Behavior normal.     ED Results / Procedures / Treatments   Labs (all labs ordered are listed, but only abnormal results are displayed) Labs Reviewed  BASIC METABOLIC PANEL - Abnormal; Notable for the following components:      Result Value   Sodium 130 (*)    Chloride 95 (*)    All other components within normal limits  CBC WITH DIFFERENTIAL/PLATELET - Abnormal; Notable for the following components:   WBC 12.8 (*)    Neutro Abs 9.8 (*)    All other components within normal limits  URINALYSIS, ROUTINE W REFLEX MICROSCOPIC - Abnormal;  Notable for the following components:   APPearance HAZY (*)    Specific Gravity, Urine <1.005 (*)    Hgb urine dipstick LARGE (*)    Protein, ur TRACE (*)    Leukocytes,Ua LARGE (*)    All other components within normal limits  URINALYSIS, MICROSCOPIC (REFLEX) - Abnormal; Notable for the following components:   Bacteria, UA FEW (*)    All other components within normal limits    EKG None  Radiology No results found.  Procedures Procedures    Medications Ordered in ED Medications  cephALEXin (KEFLEX) capsule 500 mg (has no administration in time range)    ED Course/ Medical Decision Making/ A&P Clinical Course as of 02/12/22 2330  Sun Feb 12, 2022  2318 Labs reviewed and interpreted by myself showing mild dehydration with mild hyponatremia and hypochloremia.  The patient has been able to tolerate p.o. and is drinking fluids.  Urine is consistent with UTI with large leuks, hemoglobin and bacteria.  She will be treated for UTI with Keflex.  Bladder scan was normal here showing no urinary retention. [VK]    Clinical Course User Index [VK] Ottie Glazier, DO                           Medical Decision Making Patient is a 66 year old female with a past medical history of hypertension, hyperlipidemia and prediabetes presenting to the emergency department with difficulty with urination.  Patient will be evaluated for urinary retention with bladder scan, UTI, AKI, dehydration or electrolyte abnormality.  She has no CVA tenderness making pyelonephritis less likely.  She has no back pain, no saddle anesthesia, no focal neurologic deficits making spinal cord pathology unlikely.  Amount and/or Complexity of Data Reviewed Labs:  Decision-making details documented in ED Course.  Risk Prescription drug management.          Final Clinical Impression(s) / ED Diagnoses Final diagnoses:  None    Rx / DC Orders ED Discharge Orders     None         Ottie Glazier, DO 02/12/22 2331

## 2022-02-12 NOTE — Discharge Instructions (Addendum)
You were seen in the emergency department for your difficulty with urination.  He were able to fully empty your bladder here.  Your urine does appear to be infected with a UTI and this is treated with antibiotics.  He should complete the course as prescribed.  You should follow-up with your primary doctor to have your symptoms rechecked and also have labs rechecked as you did appear slightly dehydrated on your labs.  You should return to the emergency department if your pain gets significantly worse, you have repetitive vomiting, you have fevers, or if you have any other new or concerning symptoms.

## 2022-02-12 NOTE — ED Triage Notes (Signed)
Patient had trouble urinating all day. Then she stated a few minutes ago while waiting in triage she wet her pants and ran to the bathroom. She said she went to a golf tournament, thinks she got dehydrated.

## 2022-03-31 ENCOUNTER — Other Ambulatory Visit: Payer: Self-pay | Admitting: Primary Care

## 2022-03-31 DIAGNOSIS — Z1231 Encounter for screening mammogram for malignant neoplasm of breast: Secondary | ICD-10-CM

## 2022-04-12 ENCOUNTER — Ambulatory Visit (INDEPENDENT_AMBULATORY_CARE_PROVIDER_SITE_OTHER): Payer: Medicare Other | Admitting: Primary Care

## 2022-04-12 ENCOUNTER — Encounter: Payer: Self-pay | Admitting: Primary Care

## 2022-04-12 VITALS — BP 122/68 | HR 64 | Temp 97.9°F | Ht 60.5 in | Wt 153.0 lb

## 2022-04-12 DIAGNOSIS — Z0000 Encounter for general adult medical examination without abnormal findings: Secondary | ICD-10-CM

## 2022-04-12 DIAGNOSIS — E785 Hyperlipidemia, unspecified: Secondary | ICD-10-CM | POA: Diagnosis not present

## 2022-04-12 DIAGNOSIS — Z23 Encounter for immunization: Secondary | ICD-10-CM

## 2022-04-12 DIAGNOSIS — F3342 Major depressive disorder, recurrent, in full remission: Secondary | ICD-10-CM

## 2022-04-12 DIAGNOSIS — G5603 Carpal tunnel syndrome, bilateral upper limbs: Secondary | ICD-10-CM

## 2022-04-12 DIAGNOSIS — I10 Essential (primary) hypertension: Secondary | ICD-10-CM

## 2022-04-12 DIAGNOSIS — R7303 Prediabetes: Secondary | ICD-10-CM

## 2022-04-12 DIAGNOSIS — E2839 Other primary ovarian failure: Secondary | ICD-10-CM

## 2022-04-12 DIAGNOSIS — M8589 Other specified disorders of bone density and structure, multiple sites: Secondary | ICD-10-CM

## 2022-04-12 DIAGNOSIS — E039 Hypothyroidism, unspecified: Secondary | ICD-10-CM

## 2022-04-12 DIAGNOSIS — G4733 Obstructive sleep apnea (adult) (pediatric): Secondary | ICD-10-CM

## 2022-04-12 NOTE — Assessment & Plan Note (Addendum)
Prevnar 20 and influenza vaccine due and provided. Mammogram UTD and scheduled. Bone density scan due, orders placed. Colonoscopy UTD, due 2026  Discussed the importance of a healthy diet and regular exercise in order for weight loss, and to reduce the risk of further co-morbidity.  Exam stable. Labs pending.  Follow up in 1 year for repeat physical.  I have personally reviewed and have noted: 1. The patient's medical and social history 2. Their use of alcohol, tobacco or illicit drugs 3. Their current medications and supplements 4. The patient's functional ability including ADL's, fall risks, home safety risks and   hearing or visual impairment. 5. Diet and physical activities 6. Evidence for depression or mood disorder

## 2022-04-12 NOTE — Patient Instructions (Signed)
Stop by the lab prior to leaving today. I will notify you of your results once received.   [facility] to schedule your [facility] density scan.

## 2022-04-12 NOTE — Assessment & Plan Note (Signed)
Controlled.  Continue lisinopril 10 mg daily. BMP pending.

## 2022-04-12 NOTE — Assessment & Plan Note (Signed)
She is taking levothyroxine correctly. Continue levothyroxine 112 mcg daily. Repeat TSH pending.

## 2022-04-12 NOTE — Assessment & Plan Note (Signed)
Bone density scan due, orders placed. Continue calcium and vitamin D daily. Encouraged weight bearing exercise.

## 2022-04-12 NOTE — Assessment & Plan Note (Signed)
Repeat lipid panel pending. Continue atorvastatin 40 mg daily. 

## 2022-04-12 NOTE — Assessment & Plan Note (Signed)
Following with pulmonology, compliant to CPAP nightly. Continue CPAP nightly

## 2022-04-12 NOTE — Assessment & Plan Note (Addendum)
Resolved for years.

## 2022-04-12 NOTE — Progress Notes (Signed)
HPI: Bianca Lynch presents today for [employer] Visit and for follow up of chronic medical conditions.   Past Medical History:  Diagnosis Date   Allergy    seasonal   Chicken pox    Depression    Eczema    Elevated cholesterol    GERD (gastroesophageal reflux disease)    Hypertension    Insomnia    patient denies this at PAT appt 02/09/15   Meniere disease    Menopausal symptom    HOT FLASHES   Osteopenia    Prediabetes 10/10/2016   Thyroid disease    Hypothyroid    Current Outpatient Medications  Medication Sig Dispense Refill   atorvastatin (LIPITOR) 40 MG tablet Take 1 tablet (40 mg total) by mouth daily. For cholesterol 90 tablet 3   Calcium Carb-Cholecalciferol (CALCIUM 600 + D PO) Take by mouth.     cetirizine (ZYRTEC) 10 MG tablet Take 10 mg by mouth daily.     citalopram (CELEXA) 20 MG tablet Take 1 tablet (20 mg total) by mouth daily. For depression. 90 tablet 3   levothyroxine (SYNTHROID) 112 MCG tablet Take 1 tablet by mouth every morning on an empty stomach with water only.  No food or other medications for 30 minutes. 90 tablet 3   lisinopril (ZESTRIL) 10 MG tablet TAKE 1 TABLET BY MOUTH EVERY DAY FOR BLOOD PRESSURE 90 tablet 3   Multiple Vitamins-Minerals (CENTRUM SILVER PO) Take by mouth.     No current facility-administered medications for this visit.    No Known Allergies  Family History  Problem Relation Age of Onset   Hypertension Mother    Heart disease Mother    Cancer Mother        UTERINE   Heart disease Father    Breast cancer Paternal Aunt        age 30   Colon cancer Neg Hx     Social History   Socioeconomic History   Marital status: Legally Separated    Spouse name: Not on file   Number of children: Not on file   Years of education: Not on file   Highest education level: Not on file  Occupational History   Not on file  Tobacco Use   Smoking status: Former    Packs/day: 1.00    Years: 15.00    Total pack years: 15.00     Types: Cigarettes    Quit date: 11/09/1999    Years since quitting: 22.4    Passive exposure: Past   Smokeless tobacco: Never  Vaping Use   Vaping Use: Never used  Substance and Sexual Activity   Alcohol use: Yes    Alcohol/week: 14.0 standard drinks of alcohol    Types: 14 Standard drinks or equivalent per week    Comment: 2 GLASSES OF WINE WITH DINNER    Drug use: No   Sexual activity: Not Currently    Birth control/protection: Post-menopausal    Comment: 1st intercourse-17, partners- 5,btl  Other Topics Concern   Not on file  Social History Narrative   Married.   No children.   Works as an Optometrist.   Enjoys bowling, play computer games, crossword puzzles   Social Determinants of Health   Financial Resource Strain: Not on file  Food Insecurity: Not on file  Transportation Needs: Not on file  Physical Activity: Not on file  Stress: Not on file  Social Connections: Not on file  Intimate Partner Violence: Not on file  Hospitiliaztions: None.  Health Maintenance:     Vaccines:  Influenza: Due today  Tetanus: 2015  Pneumovax: Never completed  Prevnar: Due, never completed  Zostavax/Shingrix: Completed both Zostavax and Shingrix   Imaging/Procedures:   Bone Density: August 2020  Colonoscopy: 2016, due 2026  Mammogram: November 2022, scheduled for November 2023   Other:   Eye Doctor: Completes annually   Dental Exam: Completes semi-annually   BP Readings from Last 3 Encounters:  04/12/22 122/68  02/12/22 (!) 147/83  01/24/22 122/78       Providers: Alma Friendly, PCP, Dr. Dellis Filbert, GYN, Geraldo Pitter, Pulmonology, Dr. Dione Booze, Neurology    I have personally reviewed and have noted: 1. The patient's medical and social history 2. Their use of alcohol, tobacco or illicit drugs 3. Their current medications and supplements 4. The patient's functional ability including ADL's, fall risks, home safety risks and   hearing or visual impairment. 5. Diet  and physical activities 6. Evidence for depression or mood disorder  Subjective:   Review of Systems:   Constitutional: Denies fever, malaise, fatigue, headache or abrupt weight changes.  HEENT: Denies eye pain, eye redness, ear pain, ringing in the ears, runny nose, nasal congestion, bloody nose, or sore throat. Respiratory: Denies difficulty breathing, shortness of breath, cough with/without sputum production.   Cardiovascular: Denies chest pain, chest tightness, palpitations, or swelling in the hands or feet.  Gastrointestinal: Denies abdominal pain, bloating, constipation, diarrhea,  or blood in the stool.  GU: Denies urgency, frequency, dysuria, hematuria, odor or discharge. Musculoskeletal: Denies decrease in range of motion, difficulty with gait, muscle pain or joint pain and swelling.  Skin: Denies redness, rashes, lesions, or ulcercations.  Neurological: Denies dizziness, difficulty with memory, difficulty with speech, or problems with balance and coordination.  Psychiatric: Denies concerns for anxiety or depression.   No other specific complaints in a complete review of systems (except as listed in HPI above).  Objective:  PE:   BP 122/68   Pulse 64   Temp 97.9 F (36.6 C) (Temporal)   Ht 5' 0.5" (1.537 m)   Wt 153 lb (69.4 kg)   SpO2 97%   BMI 29.39 kg/m  Wt Readings from Last 3 Encounters:  04/12/22 153 lb (69.4 kg)  02/12/22 150 lb (68 kg)  01/24/22 150 lb (68 kg)    General: Appears their stated age, well developed, well nourished in NAD. Skin: Warm, dry and intact. No rashes, lesions or ulcerations noted. HEENT: Head: normal shape and size; Eyes: sclera white, no icterus, conjunctiva pink, PERRLA and EOMs intact; Ears: Tm's gray and intact, normal light reflex; Nose: mucosa pink and moist, septum midline; Throat/Mouth: Teeth present, mucosa pink and moist, no exudate, lesions or ulcerations noted.  Neck: Normal range of motion. Neck supple, trachea midline. No  massses, lumps or thyromegaly present.  Cardiovascular: Normal rate and rhythm. S1,S2 noted.  No murmur, rubs or gallops noted. No JVD or BLE edema. No carotid bruits noted. Pulmonary/Chest: Normal effort and positive vesicular breath sounds. No respiratory distress. No wheezes, rales or ronchi noted.  Abdomen: Soft and nontender. Normal bowel sounds, no bruits noted. No distention or masses noted. Liver, spleen and kidneys non palpable. Musculoskeletal: Normal range of motion. No signs of joint swelling. No difficulty with gait.  Neurological: Alert and oriented. Cranial nerves II-XII intact. Coordination normal. +DTRs bilaterally. Psychiatric: Mood and affect normal. Behavior is normal. Judgment and thought content normal.   EKG: sinus bradycardia with rate of 59. No PAC/PVC, no  acute ST changes. No old ECG to compare.   BMET    Component Value Date/Time   NA 130 (L) 02/12/2022 2119   K 4.2 02/12/2022 2119   CL 95 (L) 02/12/2022 2119   CO2 27 02/12/2022 2119   GLUCOSE 99 02/12/2022 2119   BUN 15 02/12/2022 2119   CREATININE 0.82 02/12/2022 2119   CALCIUM 9.1 02/12/2022 2119   GFRNONAA >60 02/12/2022 2119    Lipid Panel     Component Value Date/Time   CHOL 141 03/11/2021 0956   TRIG 81.0 03/11/2021 0956   HDL 50.20 03/11/2021 0956   CHOLHDL 3 03/11/2021 0956   VLDL 16.2 03/11/2021 0956   LDLCALC 75 03/11/2021 0956    CBC    Component Value Date/Time   WBC 12.8 (H) 02/12/2022 2119   RBC 4.04 02/12/2022 2119   HGB 13.3 02/12/2022 2119   HCT 39.1 02/12/2022 2119   PLT 266 02/12/2022 2119   MCV 96.8 02/12/2022 2119   MCH 32.9 02/12/2022 2119   MCHC 34.0 02/12/2022 2119   RDW 12.8 02/12/2022 2119   LYMPHSABS 2.1 02/12/2022 2119   MONOABS 0.7 02/12/2022 2119   EOSABS 0.1 02/12/2022 2119   BASOSABS 0.0 02/12/2022 2119    Hgb A1C Lab Results  Component Value Date   HGBA1C 5.4 01/09/2020      Assessment and Plan:   Medicare Annual Wellness Visit:  Diet: Fair  diet. Physical activity: Active Depression/mood screen: Negative Hearing: Intact to whispered voice, with hearing aid. Visual acuity: Grossly normal, performs annual eye exam  ADLs: Capable Fall risk: None Home safety: Good Cognitive evaluation: Intact to orientation, naming, recall and repetition EOL planning: Adv directives, full code/ I agree  Preventative Medicine: Prevnar 20 and influenza vaccines provided today. Mammogram UTD. Bone density scan due and pending.colonoscopy UTD, due 2026  Next appointment: 1 year or sooner if needed

## 2022-04-12 NOTE — Assessment & Plan Note (Signed)
Controlled. ? ?Continue citalopram 20 mg daily. ?

## 2022-04-12 NOTE — Assessment & Plan Note (Signed)
Following with neurology, undergoing testing. Will likely have carpal tunnel release.

## 2022-04-13 LAB — COMPREHENSIVE METABOLIC PANEL
ALT: 20 U/L (ref 0–35)
AST: 21 U/L (ref 0–37)
Albumin: 4.6 g/dL (ref 3.5–5.2)
Alkaline Phosphatase: 46 U/L (ref 39–117)
BUN: 16 mg/dL (ref 6–23)
CO2: 31 mEq/L (ref 19–32)
Calcium: 9.7 mg/dL (ref 8.4–10.5)
Chloride: 100 mEq/L (ref 96–112)
Creatinine, Ser: 1.03 mg/dL (ref 0.40–1.20)
GFR: 56.87 mL/min — ABNORMAL LOW (ref >60.00)
Glucose, Bld: 82 mg/dL (ref 70–99)
Potassium: 4.1 mEq/L (ref 3.5–5.1)
Sodium: 139 mEq/L (ref 135–145)
Total Bilirubin: 0.6 mg/dL (ref 0.2–1.2)
Total Protein: 7.1 g/dL (ref 6.0–8.3)

## 2022-04-13 LAB — LIPID PANEL
Cholesterol: 172 mg/dL (ref 0–200)
HDL: 63.6 mg/dL (ref >39.00)
LDL Cholesterol: 85 mg/dL (ref 0–99)
NonHDL: 108.35
Total CHOL/HDL Ratio: 3
Triglycerides: 119 mg/dL (ref 0.0–149.0)
VLDL: 23.8 mg/dL (ref 0.0–40.0)

## 2022-04-13 LAB — TSH: TSH: 2.05 u[IU]/mL (ref 0.35–5.50)

## 2022-04-19 ENCOUNTER — Other Ambulatory Visit: Payer: Self-pay | Admitting: Primary Care

## 2022-04-19 DIAGNOSIS — E039 Hypothyroidism, unspecified: Secondary | ICD-10-CM

## 2022-04-19 DIAGNOSIS — I10 Essential (primary) hypertension: Secondary | ICD-10-CM

## 2022-04-19 DIAGNOSIS — F3342 Major depressive disorder, recurrent, in full remission: Secondary | ICD-10-CM

## 2022-04-19 DIAGNOSIS — E785 Hyperlipidemia, unspecified: Secondary | ICD-10-CM

## 2022-04-19 MED ORDER — LEVOTHYROXINE SODIUM 112 MCG PO TABS: ORAL_TABLET | ORAL | 3 refills | Status: DC

## 2022-05-18 ENCOUNTER — Ambulatory Visit
Admission: RE | Admit: 2022-05-18 | Discharge: 2022-05-18 | Disposition: A | Discharge status: Home / Self Care | Payer: Medicare Other | Source: Ambulatory Visit | Attending: Primary Care | Admitting: Primary Care

## 2022-05-18 DIAGNOSIS — Z1231 Encounter for screening mammogram for malignant neoplasm of breast: Secondary | ICD-10-CM

## 2022-07-10 HISTORY — PX: OTHER SURGICAL HISTORY: SHX169

## 2022-10-09 ENCOUNTER — Ambulatory Visit
Admission: RE | Admit: 2022-10-09 | Discharge: 2022-10-09 | Disposition: A | Discharge status: Home / Self Care | Payer: Medicare Other | Source: Ambulatory Visit | Attending: Primary Care | Admitting: Primary Care

## 2022-10-09 DIAGNOSIS — E2839 Other primary ovarian failure: Secondary | ICD-10-CM

## 2023-01-21 IMAGING — CT CT HEAD W/O CM
4 series · 16 of 47 positions shown, 18 images · non-contrast
Comparison: MRI head September 24, 2008.

CLINICAL DATA: Mental status change, unknown cause



[Series 2: head wo · axial · 0.41mm/px · z∈[-138,-28]mm · 7 of 30 slices shown, 9 images]
[im 4/30  brain]
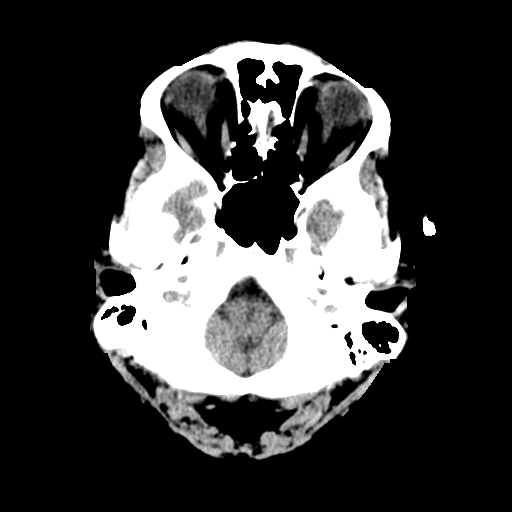
[im 4/30  bone]
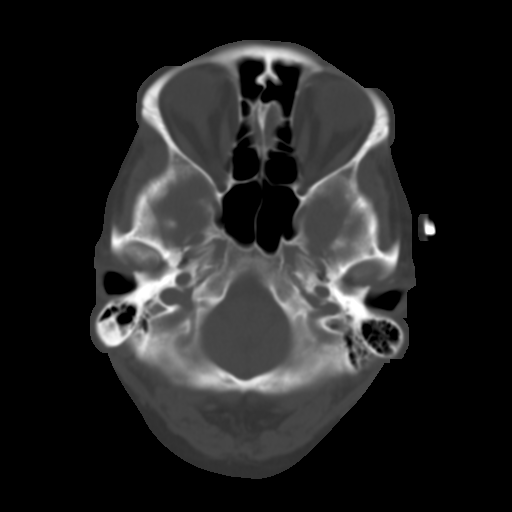
[im 8/30  brain]
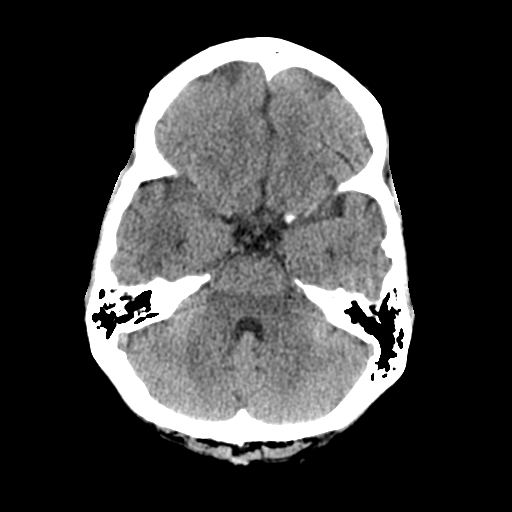
[im 11/30  brain]
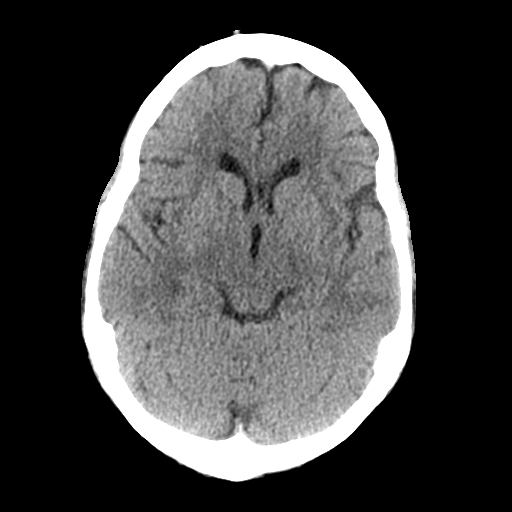
[im 15/30  brain]
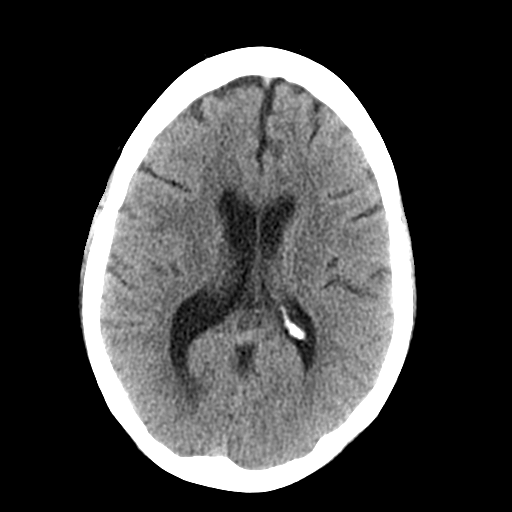
[im 19/30  brain]
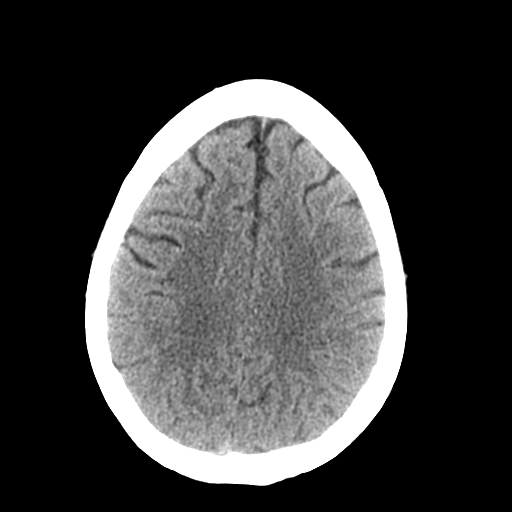
[im 19/30  bone]
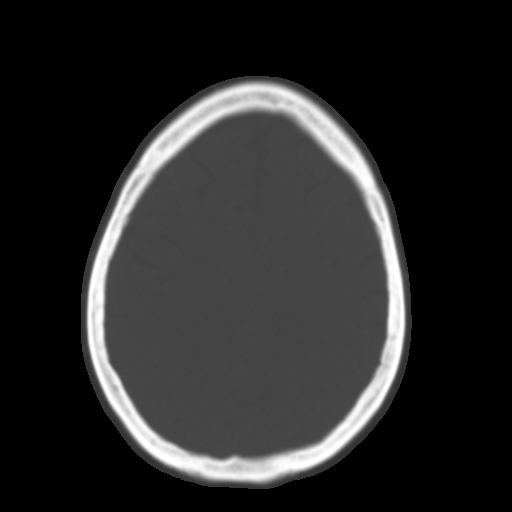
[im 22/30  brain]
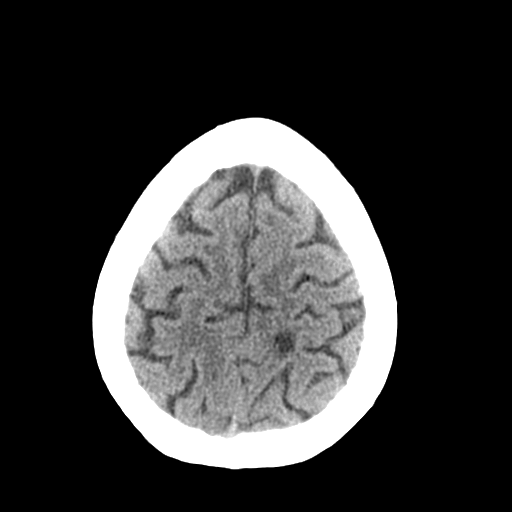
[im 26/30  brain]
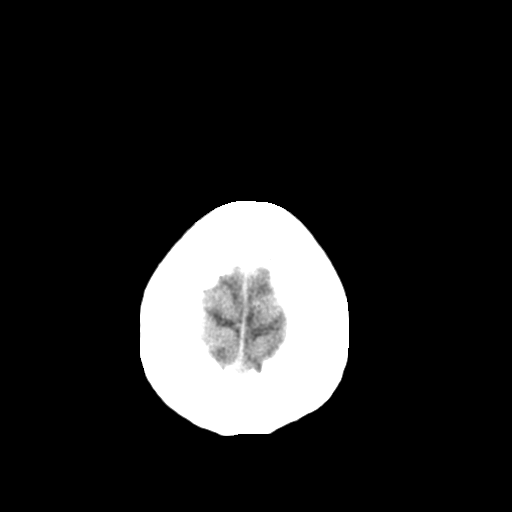

[Series 3: head bone · axial · 0.41mm/px · z∈[-139,-109]mm · 3 of 75 slices shown]
[im 8/75  bone]
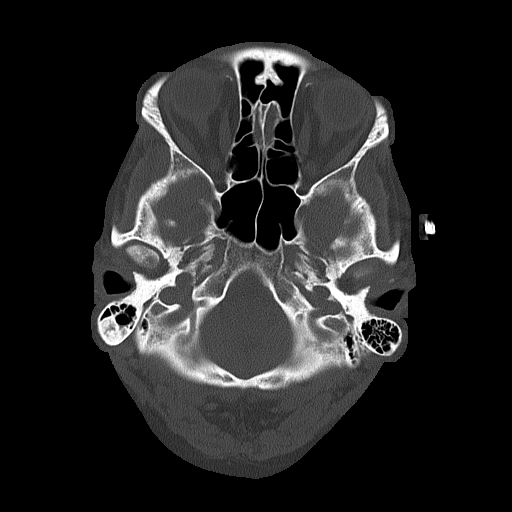
[im 15/75  bone]
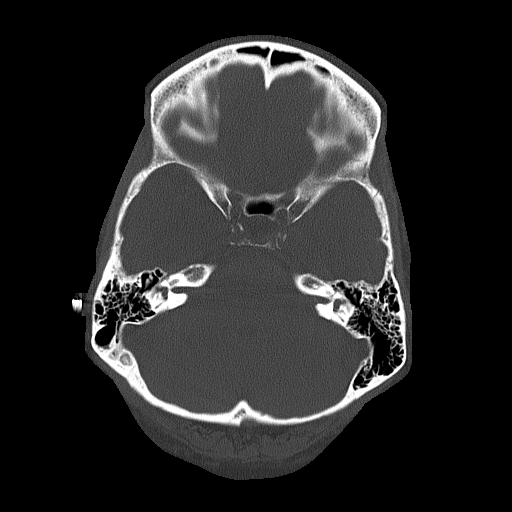
[im 23/75  bone]
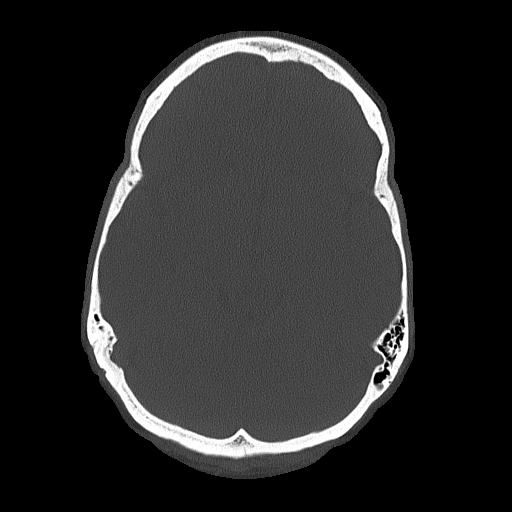

[Series 5: coronal soft tissue · coronal · 0.29mm/px · 3 of 65 slices shown]
[im 22/65  brain]
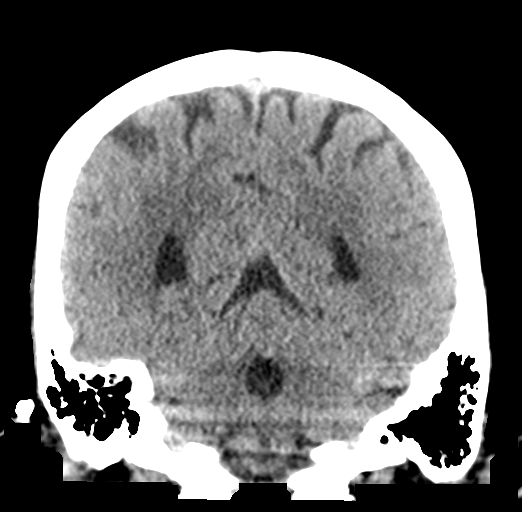
[im 29/65  brain]
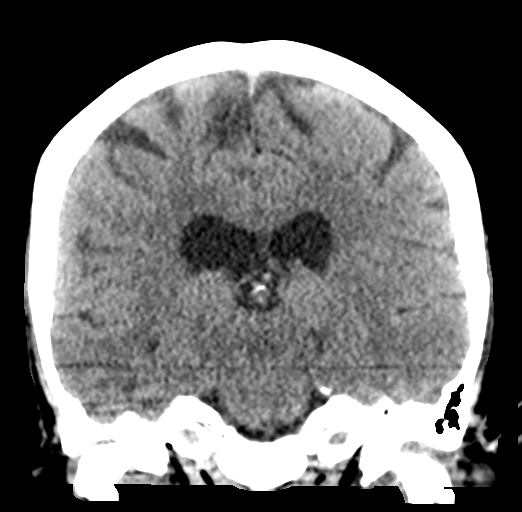
[im 36/65  brain]
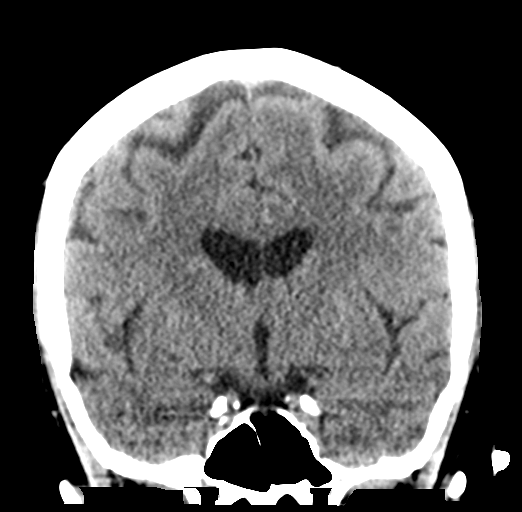

[Series 6: sagittal soft tissue · sagittal · 0.29mm/px · 3 of 52 slices shown]
[im 18/52  brain]
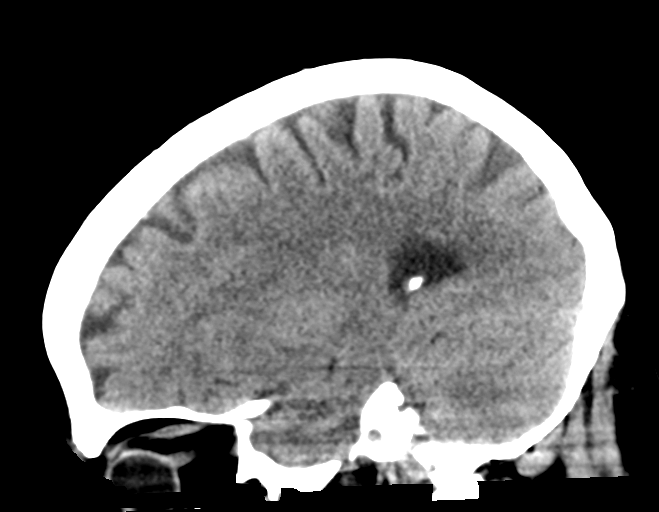
[im 26/52  brain]
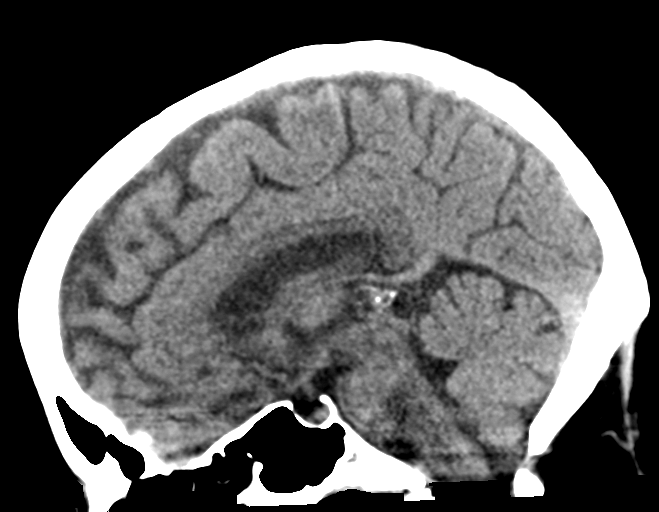
[im 35/52  brain]
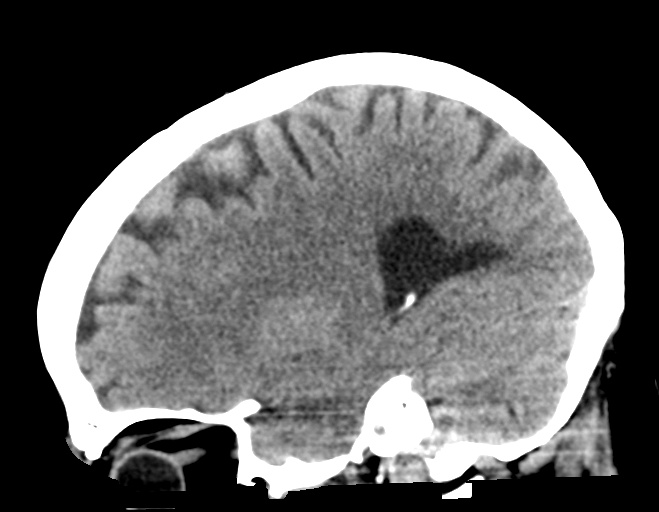

[16 of 47 positions shown; findings below may reference images not displayed]

FINDINGS: Brain: No evidence of acute large vascular territory infarction,
hemorrhage, hydrocephalus, extra-axial collection or mass
lesion/mass effect. Probable remote infarct in the subcortical left
parietal lobe, similar across modalities to 3575 MRI.

Vascular: No hyperdense vessel identified.

Skull: No acute fracture.

Sinuses/Orbits: Clear sinuses.  Visualized orbits are unremarkable.

Other: No mastoid effusions.
IMPRESSION: 1. No evidence of acute intracranial abnormality.
2. Probable remote infarct in the subcortical left parietal lobe,
similar across modalities to 3575 MRI.

## 2023-02-02 ENCOUNTER — Telehealth (INDEPENDENT_AMBULATORY_CARE_PROVIDER_SITE_OTHER): Payer: Medicare Other | Admitting: Nurse Practitioner

## 2023-02-02 ENCOUNTER — Encounter: Payer: Self-pay | Admitting: Nurse Practitioner

## 2023-02-02 VITALS — Ht 60.5 in

## 2023-02-02 DIAGNOSIS — U071 COVID-19: Secondary | ICD-10-CM | POA: Diagnosis not present

## 2023-02-02 HISTORY — DX: COVID-19: U07.1

## 2023-02-02 NOTE — Assessment & Plan Note (Signed)
COVID-19 test at home was positive.  Patient is in the window for antiviral treatment.  We did discuss possible antiviral treatments after joint decision-making discussion patient declined treatment as she is feeling much better today.  Did review signs and symptoms when to be seen urgent or emergently.  Did review CDC guidelines in regards to self-isolation/quarantine.  Continue over-the-counter symptomatic treatment as needed.  Rest and drink plenty of fluids.  Follow-up if no improvement

## 2023-02-02 NOTE — Progress Notes (Signed)
Ph: [phone] Fax: [phone]   Patient ID: [name], female    DOB: 1956/06/08, 67 y.o.   MRN: [id number]  Virtual visit completed through MyChart, a video enabled telemedicine application. Due to national recommendations of social distancing due to COVID-19, a virtual visit is felt to be most appropriate for this patient at this time. Reviewed limitations, risks, security and privacy concerns of performing a virtual visit and the availability of in person appointments. I also reviewed that there may be a patient responsible charge related to this service. The patient agreed to proceed.   Patient location: home Provider location: [place] at [facility], office Persons participating in this virtual visit: patient, provider   If any vitals were documented, they were collected by patient at home unless specified below.    Ht 5' 0.5" (1.537 m)   BMI 29.39 kg/m    CC: Covid 19 Subjective:   HPI: Sun Lallo is a 67 y.o. female presenting on 02/02/2023 for Covid Positive (Tested positive 7/24.  Sore throat, terrible cough, no energy, nasal congestion on wednesday.  Pt states she feels much better. )   Symptoms started on 01/30/2023 Tested positive on 01/31/2023 Covid vaccine: UTD Sick contacts: states that she was on a plane and bus. States that she went to [employer] that she has taken advil, advil cold and sinus, tylenol pm with sinus. Ha helped some   HTN, OSA, overweight, and age are the patients risk factors    Relevant past medical, surgical, family and social history reviewed and updated as indicated. Interim medical history since our last visit reviewed. Allergies and medications reviewed and updated. Outpatient Medications Prior to Visit  Medication Sig Dispense Refill   atorvastatin (LIPITOR) 40 MG tablet TAKE 1 TABLET BY MOUTH EVERY DAY FOR CHOLESTEROL 90 tablet 3   Calcium Carb-Cholecalciferol (CALCIUM 600 + D PO) Take by mouth.     cetirizine  (ZYRTEC) 10 MG tablet Take 10 mg by mouth daily.     citalopram (CELEXA) 20 MG tablet TAKE 1 TABLET (20 MG TOTAL) BY MOUTH DAILY. FOR DEPRESSION. 90 tablet 3   esomeprazole (NEXIUM) 20 MG capsule Take by mouth.     levothyroxine (SYNTHROID) 112 MCG tablet Take 1 tablet by mouth every morning on an empty stomach with water only.  No food or other medications for 30 minutes. 90 tablet 3   lisinopril (ZESTRIL) 10 MG tablet TAKE 1 TABLET BY MOUTH EVERY DAY FOR BLOOD PRESSURE 90 tablet 3   Multiple Vitamins-Minerals (CENTRUM SILVER PO) Take by mouth.     No facility-administered medications prior to visit.     Per HPI unless specifically indicated in ROS section below Review of Systems  Constitutional:  Positive for chills, fatigue and fever. Negative for appetite change.       Fluid intake is good   HENT:  Negative for ear discharge, ear pain and sore throat.   Respiratory:  Positive for cough (improving). Negative for shortness of breath.   Gastrointestinal:  Negative for abdominal pain, nausea and vomiting.  Musculoskeletal:  Positive for myalgias (improved).  Neurological:  Negative for headaches.   Objective:  Ht 5' 0.5" (1.537 m)   BMI 29.39 kg/m   Wt Readings from Last 3 Encounters:  04/12/22 153 lb (69.4 kg)  02/12/22 150 lb (68 kg)  01/24/22 150 lb (68 kg)       Physical exam: Gen: alert, NAD, not ill appearing Pulm: speaks in complete sentences without increased  work of breathing Psych: normal mood, normal thought content      Results for orders placed or performed in visit on 04/12/22  Lipid panel  Result Value Ref Range   Cholesterol 172 0 - 200 mg/dL   Triglycerides 409.8 0.0 - 149.0 mg/dL   HDL 11.91 >47.82 mg/dL   VLDL 95.6 0.0 - 21.3 mg/dL   LDL Cholesterol 85 0 - 99 mg/dL   Total CHOL/HDL Ratio 3    NonHDL 108.35   Comprehensive metabolic panel  Result Value Ref Range   Sodium 139 135 - 145 mEq/L   Potassium 4.1 3.5 - 5.1 mEq/L   Chloride 100 96 - 112  mEq/L   CO2 31 19 - 32 mEq/L   Glucose, Bld 82 70 - 99 mg/dL   BUN 16 6 - 23 mg/dL   Creatinine, Ser 0.86 0.40 - 1.20 mg/dL   Total Bilirubin 0.6 0.2 - 1.2 mg/dL   Alkaline Phosphatase 46 39 - 117 U/L   AST 21 0 - 37 U/L   ALT 20 0 - 35 U/L   Total Protein 7.1 6.0 - 8.3 g/dL   Albumin 4.6 3.5 - 5.2 g/dL   GFR 57.84 (L) >69.62 mL/min   Calcium 9.7 8.4 - 10.5 mg/dL  TSH  Result Value Ref Range   TSH 2.05 0.35 - 5.50 uIU/mL   Assessment & Plan:   COVID-19 Assessment & Plan: COVID-19 test at home was positive.  Patient is in the window for antiviral treatment.  We did discuss possible antiviral treatments after joint decision-making discussion patient declined treatment as she is feeling much better today.  Did review signs and symptoms when to be seen urgent or emergently.  Did review CDC guidelines in regards to self-isolation/quarantine.  Continue over-the-counter symptomatic treatment as needed.  Rest and drink plenty of fluids.  Follow-up if no improvement      I discussed the assessment and treatment plan with the patient. The patient was provided an opportunity to ask questions and all were answered. The patient agreed with the plan and demonstrated an understanding of the instructions. The patient was advised to call back or seek an in-person evaluation if the symptoms worsen or if the condition fails to improve as anticipated.  Follow up plan: Return if symptoms worsen or fail to improve.  Audria Nine, NP

## 2023-02-02 NOTE — Patient Instructions (Signed)
Nice to see you today. Continue using over-the-counter medications to help with symptom management. Rest and drink plenty of fluids.  Still get up walk around and do deep breathing exercises to keep the lungs expanded. Follow-up if no improvement

## 2023-02-12 ENCOUNTER — Telehealth: Payer: Self-pay

## 2023-02-12 NOTE — Patient Outreach (Signed)
  Care Coordination   Initial Visit Note   02/12/2023 Name: Bianca Lynch MRN: [id number] DOB: 03/25/1956  Bianca Lynch is a 67 y.o. year old female who sees Bianca Nest, NP for primary care. I spoke with  Bianca Lynch by phone today.  What matters to the patients health and wellness today?  Patient denies any need for nursing or community resource needs.  Patient states she is due her annual physical exam and will call to schedule.    Goals Addressed             This Visit's Progress    Care coordinations activities - no follow up needed.       Interventions Today    Flowsheet Row Most Recent Value  General Interventions   General Interventions Discussed/Reviewed General Interventions Discussed  [Care coordination services discussed. SDOH survey completed. AWV discussed and patient advised to contact provider office to schedule. Discussed vaccines. Advised to contact PCP office if care coordination services needed in the future.]              SDOH assessments and interventions completed:  Yes  SDOH Interventions Today    Flowsheet Row Most Recent Value  SDOH Interventions   Food Insecurity Interventions Intervention Not Indicated  Housing Interventions Intervention Not Indicated  Transportation Interventions Intervention Not Indicated        Care Coordination Interventions:  Yes, provided   Follow up plan: No further intervention required.   Encounter Outcome:  Pt. Visit Completed   Bianca Ina RN,BSN,CCM [facility] Care Coordination [phone] direct line

## 2023-04-11 ENCOUNTER — Other Ambulatory Visit: Payer: Self-pay | Admitting: Primary Care

## 2023-04-11 DIAGNOSIS — F3342 Major depressive disorder, recurrent, in full remission: Secondary | ICD-10-CM

## 2023-04-11 DIAGNOSIS — I10 Essential (primary) hypertension: Secondary | ICD-10-CM

## 2023-04-11 DIAGNOSIS — E785 Hyperlipidemia, unspecified: Secondary | ICD-10-CM

## 2023-04-11 NOTE — Telephone Encounter (Signed)
Patient is due for follow up, this will be required prior to any further refills.  Please schedule, thank you!    

## 2023-04-11 NOTE — Telephone Encounter (Signed)
Patient has been scheduled

## 2023-04-17 ENCOUNTER — Ambulatory Visit: Payer: Medicare Other | Admitting: Primary Care

## 2023-04-18 ENCOUNTER — Encounter: Payer: Self-pay | Admitting: Primary Care

## 2023-04-18 ENCOUNTER — Ambulatory Visit (INDEPENDENT_AMBULATORY_CARE_PROVIDER_SITE_OTHER): Payer: Medicare Other | Admitting: Primary Care

## 2023-04-18 VITALS — BP 112/56 | HR 60 | Temp 97.3°F | Ht 60.5 in | Wt 177.0 lb

## 2023-04-18 DIAGNOSIS — I10 Essential (primary) hypertension: Secondary | ICD-10-CM

## 2023-04-18 DIAGNOSIS — F3342 Major depressive disorder, recurrent, in full remission: Secondary | ICD-10-CM

## 2023-04-18 DIAGNOSIS — E785 Hyperlipidemia, unspecified: Secondary | ICD-10-CM | POA: Diagnosis not present

## 2023-04-18 DIAGNOSIS — E039 Hypothyroidism, unspecified: Secondary | ICD-10-CM | POA: Diagnosis not present

## 2023-04-18 DIAGNOSIS — G4733 Obstructive sleep apnea (adult) (pediatric): Secondary | ICD-10-CM | POA: Diagnosis not present

## 2023-04-18 DIAGNOSIS — Z1231 Encounter for screening mammogram for malignant neoplasm of breast: Secondary | ICD-10-CM

## 2023-04-18 DIAGNOSIS — G4700 Insomnia, unspecified: Secondary | ICD-10-CM

## 2023-04-18 DIAGNOSIS — M8589 Other specified disorders of bone density and structure, multiple sites: Secondary | ICD-10-CM

## 2023-04-18 LAB — COMPREHENSIVE METABOLIC PANEL
ALT: 28 U/L (ref 0–35)
AST: 22 U/L (ref 0–37)
Albumin: 4.4 g/dL (ref 3.5–5.2)
Alkaline Phosphatase: 53 U/L (ref 39–117)
BUN: 17 mg/dL (ref 6–23)
CO2: 32 meq/L (ref 19–32)
Calcium: 9.7 mg/dL (ref 8.4–10.5)
Chloride: 101 meq/L (ref 96–112)
Creatinine, Ser: 0.89 mg/dL (ref 0.40–1.20)
GFR: 67.28 mL/min (ref >60.00)
Glucose, Bld: 94 mg/dL (ref 70–99)
Potassium: 4.4 meq/L (ref 3.5–5.1)
Sodium: 140 meq/L (ref 135–145)
Total Bilirubin: 0.7 mg/dL (ref 0.2–1.2)
Total Protein: 6.7 g/dL (ref 6.0–8.3)

## 2023-04-18 LAB — TSH: TSH: 2.21 u[IU]/mL (ref 0.35–5.50)

## 2023-04-18 LAB — CBC
HCT: 41.2 % (ref 36.0–46.0)
Hemoglobin: 13.6 g/dL (ref 12.0–15.0)
MCHC: 33 g/dL (ref 30.0–36.0)
MCV: 98.5 fL (ref 78.0–100.0)
Platelets: 273 10*3/uL (ref 150.0–400.0)
RBC: 4.18 Mil/uL (ref 3.87–5.11)
RDW: 13.6 % (ref 11.5–15.5)
WBC: 6.3 10*3/uL (ref 4.0–10.5)

## 2023-04-18 LAB — LIPID PANEL
Cholesterol: 210 mg/dL — ABNORMAL HIGH (ref 0–200)
HDL: 61.3 mg/dL (ref >39.00)
LDL Cholesterol: 102 mg/dL — ABNORMAL HIGH (ref 0–99)
NonHDL: 148.98
Total CHOL/HDL Ratio: 3
Triglycerides: 233 mg/dL — ABNORMAL HIGH (ref 0.0–149.0)
VLDL: 46.6 mg/dL — ABNORMAL HIGH (ref 0.0–40.0)

## 2023-04-18 NOTE — Assessment & Plan Note (Signed)
Continue CPAP use nightly.

## 2023-04-18 NOTE — Progress Notes (Signed)
Subjective:    Patient ID: Bianca Lynch, female    DOB: Jun 11, 1956, 67 y.o.   MRN: [id number]  HPI  Bianca Lynch is a very pleasant 67 y.o. female with a history of hypertension, OSA, hypothyroidism, osteopenia, depression, hyperlipidemia who presents today for follow-up of chronic conditions.  1) Hypertension/Hyperlipidemia: Currently managed on lisinopril 10 mg daily and atorvastatin 40 mg daily.  She is due for repeat lipid panel today. She denies chest pain, dizziness, headaches.   Wt Readings from Last 3 Encounters:  04/18/23 177 lb (80.3 kg)  04/12/22 153 lb (69.4 kg)  02/12/22 150 lb (68 kg)     BP Readings from Last 3 Encounters:  04/18/23 (!) 112/56  04/12/22 122/68  02/12/22 (!) 147/83   2) Hypothyroidism: Currently managed on levothyroxine 112 mcg tablets daily.  She is taking her levothyroxine every morning on an empty stomach with water only.  No food or other medications for at least 30 minutes.  No iron pills, heartburn medicine, vitamins, magnesium for 4 hours or later.  She is due for repeat TSH today.  3) Depression: Currently managed on citalopram 20 mg daily. Overall she feels well managed on her current regimen. She denies increased symptoms of depression.   Immunizations: -Tetanus: Completed in 2015 -Influenza: Completed today -Shingles: Completed Shingrix series -Pneumonia: Completed Prevnar 20 in 2023  Mammogram: Completed in November 2023 Bone Density Scan: Completed in April 2024  Colonoscopy: Completed in 2016, due 2026    Review of Systems  Constitutional:  Negative for unexpected weight change.  HENT:  Negative for rhinorrhea.   Respiratory:  Negative for cough and shortness of breath.   Cardiovascular:  Negative for chest pain.  Gastrointestinal:  Negative for constipation and diarrhea.  Musculoskeletal:  Negative for arthralgias and myalgias.  Skin:  Negative for rash.  Allergic/Immunologic: Negative for environmental  allergies.  Neurological:  Negative for dizziness and headaches.  Psychiatric/Behavioral:  The patient is not nervous/anxious.          Past Medical History:  Diagnosis Date   Allergy    seasonal   Chicken pox    COVID-19 02/02/2023   Depression    Eczema    Elevated cholesterol    GERD (gastroesophageal reflux disease)    Hypertension    Insomnia    patient denies this at PAT appt 02/09/15   Meniere disease    Menopausal symptom    HOT FLASHES   Osteopenia    Other abnormal cytological finding of specimen from cervix 11/18/2014   Prediabetes 10/10/2016   Thyroid disease    Hypothyroid    Social History   Socioeconomic History   Marital status: Legally Separated    Spouse name: Not on file   Number of children: Not on file   Years of education: Not on file   Highest education level: Bachelor's degree (e.g., BA, AB, BS)  Occupational History   Not on file  Tobacco Use   Smoking status: Former    Current packs/day: 0.00    Average packs/day: 1 pack/day for 15.0 years (15.0 ttl pk-yrs)    Types: Cigarettes    Start date: 11/08/1984    Quit date: 11/09/1999    Years since quitting: 23.4    Passive exposure: Past   Smokeless tobacco: Never  Vaping Use   Vaping status: Never Used  Substance and Sexual Activity   Alcohol use: Yes    Alcohol/week: 14.0 standard drinks of alcohol    Types: 14  Standard drinks or equivalent per week    Comment: 2 GLASSES OF WINE WITH DINNER    Drug use: No   Sexual activity: Not Currently    Birth control/protection: Post-menopausal    Comment: 1st intercourse-17, partners- 5,btl  Other Topics Concern   Not on file  Social History Narrative   Married.   No children.   Works as an Airline pilot.   Enjoys bowling, play computer games, crossword puzzles   Social Determinants of Health   Financial Resource Strain: Low Risk  (04/17/2023)   Overall Financial Resource Strain (CARDIA)    Difficulty of Paying Living Expenses: Not hard at  all  Food Insecurity: No Food Insecurity (04/17/2023)   Hunger Vital Sign    Worried About Running Out of Food in the Last Year: Never true    Ran Out of Food in the Last Year: Never true  Transportation Needs: No Transportation Needs (04/17/2023)   PRAPARE - Administrator, Civil Service (Medical): No    Lack of Transportation (Non-Medical): No  Physical Activity: Insufficiently Active (04/17/2023)   Exercise Vital Sign    Days of Exercise per Week: 1 day    Minutes of Exercise per Session: 10 min  Stress: No Stress Concern Present (04/17/2023)   [employer] of Occupational Health - Occupational Stress Questionnaire    Feeling of Stress : Only a little  Social Connections: Socially Integrated (04/17/2023)   Social Connection and Isolation Panel [NHANES]    Frequency of Communication with Friends and Family: More than three times a week    Frequency of Social Gatherings with Friends and Family: Once a week    Attends Religious Services: More than 4 times per year    Active Member of Clubs or Organizations: Yes    Attends Banker Meetings: More than 4 times per year    Marital Status: Living with partner  Intimate Partner Violence: Not on file    Past Surgical History:  Procedure Laterality Date   Carpel tunnel sugery Bilateral 2024   COLONOSCOPY     TUBAL LIGATION  11/22/1998   [facility]   WISDOM TOOTH EXTRACTION      Family History  Problem Relation Age of Onset   Hypertension Mother    Heart disease Mother    Cancer Mother        UTERINE   Heart disease Father    Breast cancer Paternal Aunt        age 44   Colon cancer Neg Hx     No Known Allergies  Current Outpatient Medications on File Prior to Visit  Medication Sig Dispense Refill   atorvastatin (LIPITOR) 40 MG tablet TAKE 1 TABLET BY MOUTH EVERY DAY FOR CHOLESTEROL 30 tablet 0   Calcium Carb-Cholecalciferol (CALCIUM 600 + D PO) Take by mouth.     cetirizine (ZYRTEC) 10  MG tablet Take 10 mg by mouth daily.     citalopram (CELEXA) 20 MG tablet TAKE 1 TABLET (20 MG TOTAL) BY MOUTH DAILY. FOR DEPRESSION. 30 tablet 0   esomeprazole (NEXIUM) 20 MG capsule Take by mouth.     levothyroxine (SYNTHROID) 112 MCG tablet Take 1 tablet by mouth every morning on an empty stomach with water only.  No food or other medications for 30 minutes. 90 tablet 3   lisinopril (ZESTRIL) 10 MG tablet TAKE 1 TABLET BY MOUTH EVERY DAY FOR BLOOD PRESSURE 30 tablet 0   Multiple Vitamins-Minerals (CENTRUM SILVER PO) Take by  mouth.     No current facility-administered medications on file prior to visit.    BP (!) 112/56   Pulse 60   Temp (!) 97.3 F (36.3 C) (Temporal)   Ht 5' 0.5" (1.537 m)   Wt 177 lb (80.3 kg)   SpO2 99%   BMI 34.00 kg/m  Objective:   Physical Exam HENT:     Right Ear: Tympanic membrane and ear canal normal.     Left Ear: Tympanic membrane and ear canal normal.  Eyes:     Pupils: Pupils are equal, round, and reactive to light.  Cardiovascular:     Rate and Rhythm: Normal rate and regular rhythm.  Pulmonary:     Effort: Pulmonary effort is normal.     Breath sounds: Normal breath sounds.  Abdominal:     General: Bowel sounds are normal.     Palpations: Abdomen is soft.     Tenderness: There is no abdominal tenderness.  Musculoskeletal:        General: Normal range of motion.     Cervical back: Neck supple.  Skin:    General: Skin is warm and dry.  Neurological:     Mental Status: She is alert and oriented to person, place, and time.     Cranial Nerves: No cranial nerve deficit.     Deep Tendon Reflexes:     Reflex Scores:      Patellar reflexes are 2+ on the right side and 2+ on the left side. Psychiatric:        Mood and Affect: Mood normal.           Assessment & Plan:  Hyperlipidemia, unspecified hyperlipidemia type Assessment & Plan: Repeat lipid panel pending.  Continue atorvastatin 40 mg daily.  Orders: -     Lipid panel -      Comprehensive metabolic panel  Screening mammogram for breast cancer -     3D Screening Mammogram, Left and Right; Future  Essential hypertension Assessment & Plan: Controlled.  Continue lisinopril 10 mg daily. CMP pending.    Orders: -     Comprehensive metabolic panel -     CBC  OSA (obstructive sleep apnea) Assessment & Plan: Continue CPAP use nightly.   Hypothyroidism, unspecified type Assessment & Plan: She is taking levothyroxine correctly.  Continue levothyroxine 112 mcg daily.  Orders: -     TSH  Osteopenia of multiple sites Assessment & Plan: Bone density scan up-to-date. Continue calcium and vitamin D. Repeat in 2026.   Recurrent major depressive disorder, in full remission [facility]) Assessment & Plan: Controlled.  Continue citalopram 20 mg daily.   Insomnia, unspecified type Assessment & Plan: Controlled.  Continue citalopram 20 mg daily.         Doreene Nest, NP

## 2023-04-18 NOTE — Patient Instructions (Signed)
Stop by the lab prior to leaving today. I will notify you of your results once received.   [facility] your [facility].   It was a pleasure to see you today!   

## 2023-04-18 NOTE — Assessment & Plan Note (Signed)
Controlled.  Continue citalopram 20 mg daily.

## 2023-04-18 NOTE — Assessment & Plan Note (Signed)
Controlled.   Continue lisinopril 10 mg daily.  CMP pending.  

## 2023-04-18 NOTE — Assessment & Plan Note (Signed)
Bone density scan up-to-date. Continue calcium and vitamin D. Repeat in 2026.

## 2023-04-18 NOTE — Assessment & Plan Note (Signed)
She is taking levothyroxine correctly.  Continue levothyroxine 112 mcg daily.

## 2023-04-18 NOTE — Assessment & Plan Note (Signed)
Repeat lipid panel pending. Continue atorvastatin 40 mg daily.

## 2023-05-06 ENCOUNTER — Other Ambulatory Visit: Payer: Self-pay | Admitting: Primary Care

## 2023-05-06 DIAGNOSIS — E785 Hyperlipidemia, unspecified: Secondary | ICD-10-CM

## 2023-05-06 DIAGNOSIS — I10 Essential (primary) hypertension: Secondary | ICD-10-CM

## 2023-05-06 DIAGNOSIS — F3342 Major depressive disorder, recurrent, in full remission: Secondary | ICD-10-CM

## 2023-05-07 ENCOUNTER — Ambulatory Visit: Payer: Medicare Other

## 2023-05-07 VITALS — Ht 60.0 in | Wt 170.0 lb

## 2023-05-07 DIAGNOSIS — Z0000 Encounter for general adult medical examination without abnormal findings: Secondary | ICD-10-CM

## 2023-05-07 NOTE — Patient Instructions (Signed)
Ms. Mitchell , Thank you for taking time to come for your Medicare Wellness Visit. I appreciate your ongoing commitment to your health goals. Please review the following plan we discussed and let me know if I can assist you in the future.   Referrals/Orders/Follow-Ups/Clinician Recommendations: Aim for 30 minutes of exercise or brisk walking, 6-8 glasses of water, and 5 servings of fruits and vegetables each day.   This is a list of the screening recommended for you and due dates:  Health Maintenance  Topic Date Due   COVID-19 Vaccine (4 - 2023-24 season) 03/11/2023   Flu Shot  10/08/2023*   Pap Smear  04/17/2024*   DTaP/Tdap/Td vaccine (2 - Td or Tdap) 11/12/2023   Medicare Annual Wellness Visit  05/06/2024   Mammogram  05/18/2024   Colon Cancer Screening  02/22/2025   Pneumonia Vaccine  Completed   DEXA scan (bone density measurement)  Completed   Hepatitis C Screening  Completed   Zoster (Shingles) Vaccine  Completed   HPV Vaccine  Aged Out  *Topic was postponed. The date shown is not the original due date.    Advanced directives: (Provided) Advance directive discussed with you today. I have provided a copy for you to complete at home and have notarized. Once this is complete, please bring a copy in to our office so we can scan it into your chart. Information on Advanced Care Planning can be found at [facility] Advance Health Care Directives Advance Health Care Directives ([email or web address]    Next Medicare Annual Wellness Visit scheduled for next year: Yes  Insert Preventive Care attachment Insert FALL PREVENTION attachment if needed

## 2023-05-07 NOTE — Progress Notes (Signed)
Subjective:   Bianca Lynch is a 67 y.o. female who presents for Medicare Annual (Subsequent) preventive examination.  Visit Complete: Virtual I connected with  Bianca Lynch on 05/07/23 by a audio enabled telemedicine application and verified that I am speaking with the correct person using two identifiers.  Patient Location: Home  Provider Location: Home Office  I discussed the limitations of evaluation and management by telemedicine. The patient expressed understanding and agreed to proceed.  Vital Signs: Because this visit was a virtual/telehealth visit, some criteria may be missing or patient reported. Any vitals not documented were not able to be obtained and vitals that have been documented are patient reported.  Patient Medicare AWV questionnaire was completed by the patient on 05/07/2023; I have confirmed that all information answered by patient is correct and no changes since this date.  Cardiac Risk Factors include: advanced age (>9men, >57 women);hypertension     Objective:    Today's Vitals   05/07/23 1431  Weight: 170 lb (77.1 kg)  Height: 5' (1.524 m)   Body mass index is 33.2 kg/m.     05/07/2023    2:34 PM 02/12/2022    7:27 PM 11/27/2014   12:54 PM  Advanced Directives  Does Patient Have a Medical Advance Directive? No No No  Would patient like information on creating a medical advance directive? Yes (MAU/Ambulatory/Procedural Areas - Information given) No - Patient declined No - patient declined information    Current Medications (verified) Outpatient Encounter Medications as of 05/07/2023  Medication Sig   atorvastatin (LIPITOR) 40 MG tablet TAKE 1 TABLET BY MOUTH EVERY DAY FOR CHOLESTEROL   Calcium Carb-Cholecalciferol (CALCIUM 600 + D PO) Take by mouth.   cetirizine (ZYRTEC) 10 MG tablet Take 10 mg by mouth daily.   citalopram (CELEXA) 20 MG tablet TAKE 1 TABLET (20 MG TOTAL) BY MOUTH DAILY. FOR DEPRESSION.   esomeprazole (NEXIUM) 20 MG  capsule Take by mouth.   levothyroxine (SYNTHROID) 112 MCG tablet Take 1 tablet by mouth every morning on an empty stomach with water only.  No food or other medications for 30 minutes.   lisinopril (ZESTRIL) 10 MG tablet TAKE 1 TABLET BY MOUTH EVERY DAY FOR BLOOD PRESSURE   Multiple Vitamins-Minerals (CENTRUM SILVER PO) Take by mouth.   No facility-administered encounter medications on file as of 05/07/2023.    Allergies (verified) Patient has no known allergies.   History: Past Medical History:  Diagnosis Date   Allergy    seasonal   Chicken pox    COVID-19 02/02/2023   Depression    Eczema    Elevated cholesterol    GERD (gastroesophageal reflux disease)    Hypertension    Insomnia    patient denies this at PAT appt 02/09/15   Meniere disease    Menopausal symptom    HOT FLASHES   Osteopenia    Other abnormal cytological finding of specimen from cervix 11/18/2014   Prediabetes 10/10/2016   Thyroid disease    Hypothyroid   Past Surgical History:  Procedure Laterality Date   Carpel tunnel sugery Bilateral 2024   COLONOSCOPY     TUBAL LIGATION  11/22/1998   [facility]/DR[facility]   WISDOM TOOTH EXTRACTION     Family History  Problem Relation Age of Onset   Hypertension Mother    Heart disease Mother    Cancer Mother        UTERINE   Heart disease Father    Breast cancer Paternal Aunt  age 52   Colon cancer Neg Hx    Social History   Socioeconomic History   Marital status: Legally Separated    Spouse name: Not on file   Number of children: Not on file   Years of education: Not on file   Highest education level: Bachelor's degree (e.g., BA, AB, BS)  Occupational History   Not on file  Tobacco Use   Smoking status: Former    Current packs/day: 0.00    Average packs/day: 1 pack/day for 15.0 years (15.0 ttl pk-yrs)    Types: Cigarettes    Start date: 11/08/1984    Quit date: 11/09/1999    Years since quitting: 23.5    Passive exposure: Past    Smokeless tobacco: Never  Vaping Use   Vaping status: Never Used  Substance and Sexual Activity   Alcohol use: Yes    Alcohol/week: 14.0 standard drinks of alcohol    Types: 14 Standard drinks or equivalent per week    Comment: 2 GLASSES OF WINE WITH DINNER    Drug use: No   Sexual activity: Not Currently    Birth control/protection: Post-menopausal    Comment: 1st intercourse-17, partners- 5,btl  Other Topics Concern   Not on file  Social History Narrative   Married.   No children.   Works as an Airline pilot.   Enjoys bowling, play computer games, crossword puzzles   Social Determinants of Health   Financial Resource Strain: Low Risk  (05/07/2023)   Overall Financial Resource Strain (CARDIA)    Difficulty of Paying Living Expenses: Not hard at all  Food Insecurity: No Food Insecurity (05/07/2023)   Hunger Vital Sign    Worried About Running Out of Food in the Last Year: Never true    Ran Out of Food in the Last Year: Never true  Transportation Needs: No Transportation Needs (05/07/2023)   PRAPARE - Administrator, Civil Service (Medical): No    Lack of Transportation (Non-Medical): No  Physical Activity: Inactive (05/07/2023)   Exercise Vital Sign    Days of Exercise per Week: 0 days    Minutes of Exercise per Session: 0 min  Stress: No Stress Concern Present (04/17/2023)   [employer] of Occupational Health - Occupational Stress Questionnaire    Feeling of Stress : Only a little  Social Connections: Moderately Integrated (05/07/2023)   Social Connection and Isolation Panel [NHANES]    Frequency of Communication with Friends and Family: More than three times a week    Frequency of Social Gatherings with Friends and Family: More than three times a week    Attends Religious Services: More than 4 times per year    Active Member of [employer] or Organizations: Yes    Attends Engineer, structural: More than 4 times per year    Marital Status: Separated     Tobacco Counseling Counseling given: Not Answered   Clinical Intake:  Pre-visit preparation completed: Yes  Pain : No/denies pain     Nutritional Risks: None Diabetes: No  How often do you need to have someone help you when you read instructions, pamphlets, or other written materials from your doctor or pharmacy?: 1 - Never  Interpreter Needed?: No  Information entered by :: Renie Ora, LPN   Activities of Daily Living    05/07/2023    2:34 PM 05/06/2023    9:10 PM  In your present state of health, do you have any difficulty performing the following activities:  Hearing?  0 0  Vision? 0 0  Difficulty concentrating or making decisions? 0 0  Walking or climbing stairs? 0 0  Dressing or bathing? 0 0  Doing errands, shopping? 0 0  Preparing Food and eating ? N N  Using the Toilet? N N  In the past six months, have you accidently leaked urine? N N  Do you have problems with loss of bowel control? N N  Managing your Medications? N N  Managing your Finances? N N  Housekeeping or managing your Housekeeping? N N    Patient Care Team: Doreene Nest, NP as PCP - General (Nurse Practitioner)  Indicate any recent Medical Services you may have received from other than [facility] providers in the past year (date may be approximate).     Assessment:   This is a routine wellness examination for [place].  Hearing/Vision screen Vision Screening - Comments:: Wears rx glasses - up to date with routine eye exams with  Dr.tanner    Goals Addressed             This Visit's Progress    DIET - EAT MORE FRUITS AND VEGETABLES         Depression Screen    05/07/2023    2:33 PM 02/02/2023    9:12 AM 04/12/2022    2:03 PM 03/11/2021    9:19 AM  PHQ 2/9 Scores  PHQ - 2 Score 0 0 0 0  PHQ- 9 Score  1 0 0    Fall Risk    05/07/2023    2:32 PM 05/06/2023    9:10 PM 04/18/2023    9:18 AM 02/02/2023    9:14 AM 04/12/2022    2:03 PM  Fall Risk   Falls in the past year? 0  1 1 0 0  Number falls in past yr: 0 0 0 0 0  Injury with Fall? 0 0 0 0 0  Risk for fall due to : No Fall Risks  History of fall(s) No Fall Risks   Follow up Falls prevention discussed  Falls evaluation completed Falls evaluation completed     MEDICARE RISK AT HOME: Medicare Risk at Home Any stairs in or around the home?: No If so, are there any without handrails?: No Home free of loose throw rugs in walkways, pet beds, electrical cords, etc?: Yes Adequate lighting in your home to reduce risk of falls?: Yes Life alert?: No Use of a cane, walker or w/c?: No Grab bars in the bathroom?: Yes Shower chair or bench in shower?: Yes Elevated toilet seat or a handicapped toilet?: Yes  TIMED UP AND GO:  Was the test performed?  No    Cognitive Function:        05/07/2023    2:34 PM  6CIT Screen  What Year? 0 points  What month? 0 points  What time? 0 points  Count back from 20 0 points  Months in reverse 0 points  Repeat phrase 0 points  Total Score 0 points    Immunizations Immunization History  Administered Date(s) Administered   Influenza,inj,Quad PF,6+ Mos 04/12/2017, 04/06/2018, 04/20/2019, 05/05/2020, 03/11/2021, 04/12/2022   Influenza-Unspecified 04/12/2017, 04/20/2019   Moderna Sars-Covid-2 Vaccination 09/26/2019, 10/27/2019, 06/20/2020   PNEUMOCOCCAL CONJUGATE-20 04/12/2022   Tdap 11/11/2013   Zoster Recombinant(Shingrix) 04/12/2017, 09/04/2017   Zoster, Live 10/14/2012    TDAP status: Up to date  Flu Vaccine status: Up to date  Pneumococcal vaccine status: Up to date  Covid-19 vaccine status: Completed vaccines  Qualifies  for Shingles Vaccine? Yes   Zostavax completed Yes   Shingrix Completed?: Yes  Screening Tests Health Maintenance  Topic Date Due   COVID-19 Vaccine (4 - 2023-24 season) 03/11/2023   INFLUENZA VACCINE  10/08/2023 (Originally 02/08/2023)   Cervical Cancer Screening (Pap smear)  04/17/2024 (Originally 12/05/2019)   DTaP/Tdap/Td (2 - Td  or Tdap) 11/12/2023   Medicare Annual Wellness (AWV)  05/06/2024   MAMMOGRAM  05/18/2024   Colonoscopy  02/22/2025   Pneumonia Vaccine 23+ Years old  Completed   DEXA SCAN  Completed   Hepatitis C Screening  Completed   Zoster Vaccines- Shingrix  Completed   HPV VACCINES  Aged Out    Health Maintenance  Health Maintenance Due  Topic Date Due   COVID-19 Vaccine (4 - 2023-24 season) 03/11/2023    Colorectal cancer screening: Type of screening: Colonoscopy. Completed 02/23/2015. Repeat every 10 years  Mammogram status: Ordered scheduled 05/25/2023. Pt provided with contact info and advised to call to schedule appt.   Bone Density status: Completed 10/09/2022. Results reflect: Bone density results: OSTEOPOROSIS. Repeat every 2 years.  Lung Cancer Screening: (Low Dose CT Chest recommended if Age 51-80 years, 20 pack-year currently smoking OR have quit w/in 15years.) does not qualify.   Lung Cancer Screening Referral: n/a  Additional Screening:  Hepatitis C Screening: does not qualify; Completed 11/08/2012  Vision Screening: Recommended annual ophthalmology exams for early detection of glaucoma and other disorders of the eye. Is the patient up to date with their annual eye exam?  Yes  Who is the provider or what is the name of the office in which the patient attends annual eye exams? Dr.Tanner  If pt is not established with a provider, would they like to be referred to a provider to establish care? No .   Dental Screening: Recommended annual dental exams for proper oral hygiene   Community Resource Referral / Chronic Care Management: CRR required this visit?  No   CCM required this visit?  No     Plan:     I have personally reviewed and noted the following in the patient's chart:   Medical and social history Use of alcohol, tobacco or illicit drugs  Current medications and supplements including opioid prescriptions. Patient is not currently taking opioid  prescriptions. Functional ability and status Nutritional status Physical activity Advanced directives List of other physicians Hospitalizations, surgeries, and ER visits in previous 12 months Vitals Screenings to include cognitive, depression, and falls Referrals and appointments  In addition, I have reviewed and discussed with patient certain preventive protocols, quality metrics, and best practice recommendations. A written personalized care plan for preventive services as well as general preventive health recommendations were provided to patient.     Lorrene Reid, LPN   [id number]   After Visit Summary: (MyChart) Due to this being a telephonic visit, the after visit summary with patients personalized plan was offered to patient via MyChart   Nurse Notes: none

## 2023-05-12 ENCOUNTER — Other Ambulatory Visit: Payer: Self-pay | Admitting: Primary Care

## 2023-05-12 DIAGNOSIS — E039 Hypothyroidism, unspecified: Secondary | ICD-10-CM

## 2023-05-25 ENCOUNTER — Ambulatory Visit
Admission: RE | Admit: 2023-05-25 | Discharge: 2023-05-25 | Disposition: A | Discharge status: Home / Self Care | Payer: Medicare Other | Source: Ambulatory Visit | Attending: Primary Care | Admitting: Primary Care

## 2023-05-25 DIAGNOSIS — Z1231 Encounter for screening mammogram for malignant neoplasm of breast: Secondary | ICD-10-CM

## 2023-12-21 ENCOUNTER — Encounter: Payer: Medicare Other | Admitting: Obstetrics and Gynecology

## 2023-12-25 ENCOUNTER — Ambulatory Visit (INDEPENDENT_AMBULATORY_CARE_PROVIDER_SITE_OTHER): Admitting: Obstetrics and Gynecology

## 2023-12-25 ENCOUNTER — Encounter: Payer: Self-pay | Admitting: Obstetrics and Gynecology

## 2023-12-25 VITALS — BP 122/82 | HR 55 | Resp 16 | Ht 60.25 in | Wt 181.0 lb

## 2023-12-25 DIAGNOSIS — Z8742 Personal history of other diseases of the female genital tract: Secondary | ICD-10-CM | POA: Diagnosis not present

## 2023-12-25 DIAGNOSIS — Z01419 Encounter for gynecological examination (general) (routine) without abnormal findings: Secondary | ICD-10-CM

## 2023-12-25 DIAGNOSIS — Z1231 Encounter for screening mammogram for malignant neoplasm of breast: Secondary | ICD-10-CM

## 2023-12-25 DIAGNOSIS — Z124 Encounter for screening for malignant neoplasm of cervix: Secondary | ICD-10-CM

## 2023-12-25 DIAGNOSIS — Z1331 Encounter for screening for depression: Secondary | ICD-10-CM

## 2023-12-25 DIAGNOSIS — Z9189 Other specified personal risk factors, not elsewhere classified: Secondary | ICD-10-CM | POA: Diagnosis not present

## 2023-12-25 NOTE — Progress Notes (Signed)
 68 y.o. y.o. female here for established medicare annual exam. No LMP recorded. Patient is postmenopausal.   G0 Married   RP:  Established patient presenting for annual gyn exam    HPI: Postmenopause, well on no HRT.  No PMB.  No pelvic pain.  Abstinent.  Pap Neg 11/2018. No h/o abnormal Pap.  6/17 repeat pap sebt.  Urine/BMs normal.  Breasts normal.  Screening mammo neg 05/2023.   Walking.  Health Labs with [facility] NP.  Colonoscopy 02/2015.  Bone Density normal in 02/2019. 2024 -0.8 repeat in 2 years. No fractures Former smoker.  Body mass index is 35.06 kg/m.     05/07/2023    2:33 PM 02/02/2023    9:12 AM 04/12/2022    2:03 PM  Depression screen PHQ 2/9  Decreased Interest 0 0 0  Down, Depressed, Hopeless 0 0 0  PHQ - 2 Score 0 0 0  Altered sleeping  0 0  Tired, decreased energy  1 0  Change in appetite  0 0  Feeling bad or failure about yourself   0 0  Trouble concentrating  0 0  Moving slowly or fidgety/restless  0 0  Suicidal thoughts  0 0  PHQ-9 Score  1 0  Difficult doing work/chores  Not difficult at all Not difficult at all    Blood pressure 122/82, pulse (!) 55, resp. rate 16, height 5' 0.25 (1.53 m), weight 181 lb (82.1 kg), SpO2 99%.  No results found for: DIAGPAP, HPVHIGH, ADEQPAP  GYN HISTORY: No results found for: DIAGPAP, HPVHIGH, ADEQPAP  OB History  Gravida Para Term Preterm AB Living  0       SAB IAB Ectopic Multiple Live Births          Past Medical History:  Diagnosis Date   Allergy    seasonal   Chicken pox    COVID-19 02/02/2023   Depression    Eczema    Elevated cholesterol    GERD (gastroesophageal reflux disease)    Hypertension    Insomnia    patient denies this at PAT appt 02/09/15   Meniere disease    Menopausal symptom    HOT FLASHES   Osteopenia    Other abnormal cytological finding of specimen from cervix 11/18/2014   Prediabetes 10/10/2016   Thyroid  disease    Hypothyroid    Past Surgical History:   Procedure Laterality Date   Carpel tunnel sugery Bilateral 2024   COLONOSCOPY     TUBAL LIGATION  11/22/1998   [facility]/DR. G[facility]   WISDOM TOOTH EXTRACTION      Current Outpatient Medications on File Prior to Visit  Medication Sig Dispense Refill   atorvastatin  (LIPITOR) 40 MG tablet TAKE 1 TABLET BY MOUTH EVERY DAY FOR CHOLESTEROL 90 tablet 2   Calcium  Carb-Cholecalciferol (CALCIUM  600 + D PO) Take by mouth.     cetirizine (ZYRTEC) 10 MG tablet Take 10 mg by mouth daily.     citalopram  (CELEXA ) 20 MG tablet TAKE 1 TABLET (20 MG TOTAL) BY MOUTH DAILY. FOR DEPRESSION. 90 tablet 2   esomeprazole (NEXIUM) 20 MG capsule Take by mouth.     levothyroxine  (SYNTHROID ) 112 MCG tablet TAKE 1 TABLET BY MOUTH EVERY MORNING ON EMPTY STOMACH WITH WATER ONLY. NO FOOD/MEDS FOR 30 MINTUES 90 tablet 2   lisinopril  (ZESTRIL ) 10 MG tablet TAKE 1 TABLET BY MOUTH EVERY DAY FOR BLOOD PRESSURE 90 tablet 2   Multiple Vitamins-Minerals (CENTRUM SILVER PO) Take by mouth.  No current facility-administered medications on file prior to visit.    Social History   Socioeconomic History   Marital status: Legally Separated    Spouse name: Not on file   Number of children: Not on file   Years of education: Not on file   Highest education level: Bachelor's degree (e.g., BA, AB, BS)  Occupational History   Not on file  Tobacco Use   Smoking status: Former    Current packs/day: 0.00    Average packs/day: 1 pack/day for 15.0 years (15.0 ttl pk-yrs)    Types: Cigarettes    Start date: 11/08/1984    Quit date: 11/09/1999    Years since quitting: 24.1    Passive exposure: Past   Smokeless tobacco: Never  Vaping Use   Vaping status: Never Used  Substance and Sexual Activity   Alcohol use: Not Currently    Comment: 2 GLASSES OF WINE WITH DINNER    Drug use: No   Sexual activity: Yes    Partners: Male    Birth control/protection: Post-menopausal, Surgical    Comment: 1st intercourse-17, partners-  more  than 5,btl  Other Topics Concern   Not on file  Social History Narrative   Married.   No children.   Works as an Airline pilot.   Enjoys bowling, play computer games, crossword puzzles   Social Drivers of Health   Financial Resource Strain: Low Risk  (05/07/2023)   Overall Financial Resource Strain (CARDIA)    Difficulty of Paying Living Expenses: Not hard at all  Food Insecurity: No Food Insecurity (05/07/2023)   Hunger Vital Sign    Worried About Running Out of Food in the Last Year: Never true    Ran Out of Food in the Last Year: Never true  Transportation Needs: No Transportation Needs (05/07/2023)   PRAPARE - Administrator, Civil Service (Medical): No    Lack of Transportation (Non-Medical): No  Physical Activity: Inactive (05/07/2023)   Exercise Vital Sign    Days of Exercise per Week: 0 days    Minutes of Exercise per Session: 0 min  Stress: No Stress Concern Present (04/17/2023)   [employer] of Occupational Health - Occupational Stress Questionnaire    Feeling of Stress : Only a little  Social Connections: Moderately Integrated (05/07/2023)   Social Connection and Isolation Panel    Frequency of Communication with Friends and Family: More than three times a week    Frequency of Social Gatherings with Friends and Family: More than three times a week    Attends Religious Services: More than 4 times per year    Active Member of [employer] or Organizations: Yes    Attends Banker Meetings: More than 4 times per year    Marital Status: Separated  Intimate Partner Violence: Not At Risk (05/07/2023)   Humiliation, Afraid, Rape, and Kick questionnaire    Fear of Current or Ex-Partner: No    Emotionally Abused: No    Physically Abused: No    Sexually Abused: No    Family History  Problem Relation Age of Onset   Hypertension Mother    Heart disease Mother    Cancer Mother        UTERINE   Heart disease Father    Breast cancer Paternal Aunt         age 33   Colon cancer Neg Hx      No Known Allergies    Patient's last menstrual period was No LMP  recorded. Patient is postmenopausal..            Review of Systems Alls systems reviewed and are negative.     Physical Exam Constitutional:      Appearance: Normal appearance.  Genitourinary:     Vulva and urethral meatus normal.     No lesions in the vagina.     Right Labia: No rash, lesions or skin changes.    Left Labia: No lesions, skin changes or rash.    No vaginal discharge or tenderness.     No vaginal prolapse present.    No vaginal atrophy present.     Right Adnexa: not tender, not palpable and no mass present.    Left Adnexa: not tender, not palpable and no mass present.    No cervical motion tenderness or discharge.     Uterus is not enlarged, tender or irregular.  Breasts:    Right: Normal.     Left: Normal.  HENT:     Head: Normocephalic.  Neck:     Thyroid : No thyroid  mass, thyromegaly or thyroid  tenderness.   Cardiovascular:     Rate and Rhythm: Normal rate and regular rhythm.     Heart sounds: Normal heart sounds, S1 normal and S2 normal.  Pulmonary:     Effort: Pulmonary effort is normal.     Breath sounds: Normal breath sounds and air entry.  Abdominal:     General: There is no distension.     Palpations: Abdomen is soft. There is no mass.     Tenderness: There is no abdominal tenderness. There is no guarding or rebound.   Musculoskeletal:        General: Normal range of motion.     Cervical back: Full passive range of motion without pain, normal range of motion and neck supple. No tenderness.     Right lower leg: No edema.     Left lower leg: No edema.   Neurological:     Mental Status: She is alert.   Skin:    General: Skin is warm.   Psychiatric:        Mood and Affect: Mood normal.        Behavior: Behavior normal.        Thought Content: Thought content normal.  Vitals and nursing note reviewed. Exam conducted with a  chaperone present.       A:         Well Woman GYN exam                             P:        Pap smear collected today Encouraged annual mammogram screening Colon cancer screening up-to-date DXA up-to-date Labs and immunizations to do with PMD Discussed breast self exams Encouraged healthy lifestyle practices Encouraged Vit D and Calcium    No follow-ups on file.  Reinaldo Caras

## 2023-12-26 ENCOUNTER — Other Ambulatory Visit (HOSPITAL_COMMUNITY)
Admission: RE | Admit: 2023-12-26 | Discharge: 2023-12-26 | Disposition: A | Discharge status: Home / Self Care | Source: Ambulatory Visit | Attending: Obstetrics and Gynecology | Admitting: Obstetrics and Gynecology

## 2023-12-26 DIAGNOSIS — Z01419 Encounter for gynecological examination (general) (routine) without abnormal findings: Secondary | ICD-10-CM | POA: Diagnosis present

## 2023-12-28 ENCOUNTER — Ambulatory Visit: Payer: Self-pay | Admitting: Obstetrics and Gynecology

## 2023-12-28 LAB — CYTOLOGY - PAP
Adequacy: ABSENT
Diagnosis: NEGATIVE

## 2024-01-24 ENCOUNTER — Ambulatory Visit (INDEPENDENT_AMBULATORY_CARE_PROVIDER_SITE_OTHER)
Admission: RE | Admit: 2024-01-24 | Discharge: 2024-01-24 | Disposition: A | Discharge status: Home / Self Care | Source: Ambulatory Visit | Attending: Primary Care | Admitting: Primary Care

## 2024-01-24 ENCOUNTER — Encounter: Payer: Self-pay | Admitting: Primary Care

## 2024-01-24 ENCOUNTER — Ambulatory Visit (INDEPENDENT_AMBULATORY_CARE_PROVIDER_SITE_OTHER): Admitting: Primary Care

## 2024-01-24 VITALS — BP 150/82 | HR 63 | Temp 98.8°F | Ht 60.25 in | Wt 182.5 lb

## 2024-01-24 DIAGNOSIS — G8929 Other chronic pain: Secondary | ICD-10-CM | POA: Diagnosis not present

## 2024-01-24 DIAGNOSIS — M25552 Pain in left hip: Secondary | ICD-10-CM

## 2024-01-24 MED ORDER — PREDNISONE 20 MG PO TABS: ORAL_TABLET | ORAL | 0 refills | Status: DC

## 2024-01-24 NOTE — Patient Instructions (Signed)
 Complete xray(s) prior to leaving today. I will notify you of your results once received.  Will start prednisone  medication for the sciatica.  Take 3 tablets my mouth once daily in the morning x 3 days, then 2 tablets for 3 days, then 1 tablet for 3 days.  Start walking and stretching as discussed.  It was a pleasure to see you today!

## 2024-01-24 NOTE — Assessment & Plan Note (Signed)
 Could be osteoarthritis which was likely exacerbated by her 2 recent long car rides. No alarm signs on exam or HPI.  Plain films of the left hip ordered and pending.  Start prednisone  tablets. Take 3 tablets my mouth once daily in the morning x 3 days, then 2 tablets for 3 days, then 1 tablet for 3 days.  Consider physical therapy versus orthopedic referral.  Await results and patient update after prednisone .

## 2024-01-24 NOTE — Progress Notes (Signed)
 Subjective:    Patient ID: Bianca Lynch, female    DOB: 13-Apr-1956, 68 y.o.   MRN: [id number]  HPI  Bianca Lynch is a very pleasant 68 y.o. female with a history of osteopenia, hyperlipidemia managed on statin therapy who presents today to discuss hip and lower extremity pain.  Her pain is located to the left lateral hip which has been chronic for the last 3-4 months. About 3 weeks ago she developed intermittent, sharp, stabbing pain which abated for one week. About one week ago she developed a constant achy pain from the left hip down to the left ankle. She denies numbness/tingling, loss of bowel/bladder control, injury trauma.   Over the last 3 weeks she has driven to [facility] [place]  and back then to [place]  and back.  She has noticed intermittent left SI joint pain.  She leads a sedentary lifestyle in general. She's applied heat and taken ibuprofen without much improvement.    Review of Systems  Genitourinary:        Denies loss of bowel/bladder control  Musculoskeletal:  Positive for arthralgias and back pain.  Neurological:  Negative for numbness.         Past Medical History:  Diagnosis Date   Allergy    seasonal   Chicken pox    COVID-19 02/02/2023   Depression    Eczema    Elevated cholesterol    GERD (gastroesophageal reflux disease)    Hypertension    Insomnia    patient denies this at PAT appt 02/09/15   Meniere disease    Menopausal symptom    HOT FLASHES   Osteopenia    Other abnormal cytological finding of specimen from cervix 11/18/2014   Prediabetes 10/10/2016   Thyroid  disease    Hypothyroid    Social History   Socioeconomic History   Marital status: Legally Separated    Spouse name: Not on file   Number of children: Not on file   Years of education: Not on file   Highest education level: Bachelor's degree (e.g., BA, AB, BS)  Occupational History   Not on file  Tobacco Use   Smoking status: Former    Current packs/day: 0.00     Average packs/day: 1 pack/day for 15.0 years (15.0 ttl pk-yrs)    Types: Cigarettes    Start date: 11/08/1984    Quit date: 11/09/1999    Years since quitting: 24.2    Passive exposure: Past   Smokeless tobacco: Never  Vaping Use   Vaping status: Never Used  Substance and Sexual Activity   Alcohol use: Not Currently    Comment: 2 GLASSES OF WINE WITH DINNER    Drug use: No   Sexual activity: Yes    Partners: Male    Birth control/protection: Post-menopausal, Surgical    Comment: 1st intercourse-17, partners-  more than 5,btl  Other Topics Concern   Not on file  Social History Narrative   Married.   No children.   Works as an Airline pilot.   Enjoys bowling, play computer games, crossword puzzles   Social Drivers of Health   Financial Resource Strain: Low Risk  (01/21/2024)   Overall Financial Resource Strain (CARDIA)    Difficulty of Paying Living Expenses: Not hard at all  Food Insecurity: No Food Insecurity (01/21/2024)   Hunger Vital Sign    Worried About Running Out of Food in the Last Year: Never true    Ran Out of Food in the Last Year: Never  true  Transportation Needs: No Transportation Needs (01/21/2024)   PRAPARE - Administrator, Civil Service (Medical): No    Lack of Transportation (Non-Medical): No  Physical Activity: Inactive (01/21/2024)   Exercise Vital Sign    Days of Exercise per Week: 0 days    Minutes of Exercise per Session: Not on file  Stress: No Stress Concern Present (01/21/2024)   [employer] of Occupational Health - Occupational Stress Questionnaire    Feeling of Stress: Not at all  Social Connections: Unknown (01/21/2024)   Social Connection and Isolation Panel    Frequency of Communication with Friends and Family: Twice a week    Frequency of Social Gatherings with Friends and Family: Once a week    Attends Religious Services: More than 4 times per year    Active Member of [employer] or Organizations: Not on file    Attends Tax inspector Meetings: Not on file    Marital Status: Divorced  Intimate Partner Violence: Not At Risk (05/07/2023)   Humiliation, Afraid, Rape, and Kick questionnaire    Fear of Current or Ex-Partner: No    Emotionally Abused: No    Physically Abused: No    Sexually Abused: No    Past Surgical History:  Procedure Laterality Date   Carpel tunnel sugery Bilateral 2024   COLONOSCOPY     TUBAL LIGATION  11/22/1998   [facility]/DR. GOTTSEGEN   WISDOM TOOTH EXTRACTION      Family History  Problem Relation Age of Onset   Hypertension Mother    Heart disease Mother    Cancer Mother        UTERINE   Heart disease Father    Breast cancer Paternal Aunt        age 71   Colon cancer Neg Hx     No Known Allergies  Current Outpatient Medications on File Prior to Visit  Medication Sig Dispense Refill   atorvastatin  (LIPITOR) 40 MG tablet TAKE 1 TABLET BY MOUTH EVERY DAY FOR CHOLESTEROL 90 tablet 2   Calcium  Carb-Cholecalciferol (CALCIUM  600 + D PO) Take by mouth.     cetirizine (ZYRTEC) 10 MG tablet Take 10 mg by mouth daily.     citalopram  (CELEXA ) 20 MG tablet TAKE 1 TABLET (20 MG TOTAL) BY MOUTH DAILY. FOR DEPRESSION. 90 tablet 2   esomeprazole (NEXIUM) 20 MG capsule Take by mouth.     levothyroxine  (SYNTHROID ) 112 MCG tablet TAKE 1 TABLET BY MOUTH EVERY MORNING ON EMPTY STOMACH WITH WATER ONLY. NO FOOD/MEDS FOR 30 MINTUES 90 tablet 2   lisinopril  (ZESTRIL ) 10 MG tablet TAKE 1 TABLET BY MOUTH EVERY DAY FOR BLOOD PRESSURE 90 tablet 2   Multiple Vitamins-Minerals (CENTRUM SILVER PO) Take by mouth.     No current facility-administered medications on file prior to visit.    BP (!) 150/82 (BP Location: Left Arm, Patient Position: Sitting, Cuff Size: Normal)   Pulse 63   Temp 98.8 F (37.1 C) (Oral)   Ht 5' 0.25 (1.53 m)   Wt 182 lb 8 oz (82.8 kg)   SpO2 98%   BMI 35.35 kg/m  Objective:   Physical Exam Constitutional:      General: She is not in acute distress. Cardiovascular:      Rate and Rhythm: Normal rate and regular rhythm.  Pulmonary:     Effort: Pulmonary effort is normal.     Breath sounds: Normal breath sounds.  Musculoskeletal:     Right hip: Normal  range of motion. Normal strength.     Left hip: Normal range of motion. Normal strength.     Comments: Slight pain with left hip flexion while supine.  Skin:    General: Skin is warm and dry.           Assessment & Plan:  Chronic hip pain, left Assessment & Plan: Could be osteoarthritis which was likely exacerbated by her 2 recent long car rides. No alarm signs on exam or HPI.  Plain films of the left hip ordered and pending.  Start prednisone  tablets. Take 3 tablets my mouth once daily in the morning x 3 days, then 2 tablets for 3 days, then 1 tablet for 3 days.  Consider physical therapy versus orthopedic referral.  Await results and patient update after prednisone .  Orders: -     predniSONE ; Take 3 tablets my mouth once daily in the morning x 3 days, then 2 tablets for 3 days, then 1 tablet for 3 days.  Dispense: 18 tablet; Refill: 0 -     DG HIP UNILAT W OR W/O PELVIS 2-3 VIEWS LEFT        Comer MARLA Gaskins, NP

## 2024-01-27 ENCOUNTER — Other Ambulatory Visit: Payer: Self-pay | Admitting: Primary Care

## 2024-01-27 DIAGNOSIS — E039 Hypothyroidism, unspecified: Secondary | ICD-10-CM

## 2024-01-27 DIAGNOSIS — E785 Hyperlipidemia, unspecified: Secondary | ICD-10-CM

## 2024-01-27 DIAGNOSIS — F3342 Major depressive disorder, recurrent, in full remission: Secondary | ICD-10-CM

## 2024-01-27 DIAGNOSIS — I10 Essential (primary) hypertension: Secondary | ICD-10-CM

## 2024-01-27 NOTE — Telephone Encounter (Signed)
 Patient is due for follow up in mid October, this will be required prior to any further refills.  Please schedule, thank you!

## 2024-01-28 NOTE — Telephone Encounter (Signed)
 Lvm to schedule in Mid October for follow up this is required prior to any further refills.

## 2024-01-31 ENCOUNTER — Ambulatory Visit: Payer: Self-pay | Admitting: Primary Care

## 2024-04-24 ENCOUNTER — Other Ambulatory Visit: Payer: Self-pay | Admitting: Primary Care

## 2024-04-24 DIAGNOSIS — F3342 Major depressive disorder, recurrent, in full remission: Secondary | ICD-10-CM

## 2024-04-24 DIAGNOSIS — E785 Hyperlipidemia, unspecified: Secondary | ICD-10-CM

## 2024-04-24 DIAGNOSIS — I10 Essential (primary) hypertension: Secondary | ICD-10-CM

## 2024-04-24 DIAGNOSIS — E039 Hypothyroidism, unspecified: Secondary | ICD-10-CM

## 2024-04-24 NOTE — Telephone Encounter (Signed)
Patient is due for follow up, this will be required prior to any further refills.  Please schedule, thank you!    

## 2024-04-29 ENCOUNTER — Ambulatory Visit

## 2024-05-07 ENCOUNTER — Ambulatory Visit: Payer: Medicare Other

## 2024-05-07 VITALS — BP 124/78 | Ht 60.03 in | Wt 184.6 lb

## 2024-05-07 DIAGNOSIS — Z0000 Encounter for general adult medical examination without abnormal findings: Secondary | ICD-10-CM

## 2024-05-07 DIAGNOSIS — Z23 Encounter for immunization: Secondary | ICD-10-CM

## 2024-05-07 NOTE — Progress Notes (Signed)
 Please attest and cosign this visit due to patients primary care provider not being in the office at the time the visit was completed.    Subjective:   Bianca Lynch is a 68 y.o. who presents for a Medicare Wellness preventive visit.  As a reminder, Annual Wellness Visits don't include a physical exam, and some assessments may be limited, especially if this visit is performed virtually. We may recommend an in-person follow-up visit with your provider if needed.  Visit Complete: In person  Persons Participating in Visit: Patient.  AWV Questionnaire: Yes: Patient Medicare AWV questionnaire was completed by the patient on 05/06/24; I have confirmed that all information answered by patient is correct and no changes since this date.  Cardiac Risk Factors include: advanced age (>6men, >37 women);dyslipidemia;hypertension;obesity (BMI >30kg/m2);sedentary lifestyle     Objective:    Today's Vitals   05/07/24 1545  BP: 124/78  Weight: 184 lb 9.6 oz (83.7 kg)  Height: 5' 0.03 (1.525 m)   Body mass index is 36.02 kg/m.     05/07/2024    3:54 PM 05/07/2023    2:34 PM 02/12/2022    7:27 PM 11/27/2014   12:54 PM  Advanced Directives  Does Patient Have a Medical Advance Directive? Yes No No No   Type of Estate Agent of [place];Living will     Copy of Healthcare Power of Attorney in Chart? No - copy requested     Would patient like information on creating a medical advance directive?  Yes (MAU/Ambulatory/Procedural Areas - Information given) No - Patient declined No - patient declined information      Data saved with a previous flowsheet row definition    Current Medications (verified) Outpatient Encounter Medications as of 05/07/2024  Medication Sig   atorvastatin  (LIPITOR) 40 MG tablet TAKE 1 TABLET BY MOUTH EVERY DAY FOR CHOLESTEROL   Calcium  Carb-Cholecalciferol (CALCIUM  600 + D PO) Take by mouth.   cetirizine (ZYRTEC) 10 MG tablet Take 10 mg by mouth  daily.   citalopram  (CELEXA ) 20 MG tablet TAKE 1 TABLET (20 MG TOTAL) BY MOUTH DAILY. FOR DEPRESSION.   esomeprazole (NEXIUM) 20 MG capsule Take by mouth.   levothyroxine  (SYNTHROID ) 112 MCG tablet TAKE 1 TABLET BY MOUTH EVERY MORNING ON EMPTY STOMACH WITH WATER ONLY. NO FOOD/MEDS FOR 30 MINTUES   lisinopril  (ZESTRIL ) 10 MG tablet TAKE 1 TABLET BY MOUTH EVERY DAY FOR BLOOD PRESSURE   Multiple Vitamins-Minerals (CENTRUM SILVER PO) Take by mouth.   predniSONE  (DELTASONE ) 20 MG tablet Take 3 tablets my mouth once daily in the morning x 3 days, then 2 tablets for 3 days, then 1 tablet for 3 days.   No facility-administered encounter medications on file as of 05/07/2024.    Allergies (verified) Patient has no known allergies.   History: Past Medical History:  Diagnosis Date   Allergy    seasonal   Chicken pox    COVID-19 02/02/2023   Depression    Eczema    Elevated cholesterol    GERD (gastroesophageal reflux disease)    Hypertension    Insomnia    patient denies this at PAT appt 02/09/15   Meniere disease    Menopausal symptom    HOT FLASHES   Osteopenia    Other abnormal cytological finding of specimen from cervix 11/18/2014   Prediabetes 10/10/2016   Thyroid  disease    Hypothyroid   Past Surgical History:  Procedure Laterality Date   Carpel tunnel sugery Bilateral 2024  COLONOSCOPY     TUBAL LIGATION  11/22/1998   [facility]/DR. GOTTSE[facility]   WISDOM TOOTH EXTRACTION     Family History  Problem Relation Age of Onset   Hypertension Mother    Heart disease Mother    Cancer Mother        UTERINE   Heart disease Father    Breast cancer Paternal Aunt        age 21   Colon cancer Neg Hx    Social History   Socioeconomic History   Marital status: Legally Separated    Spouse name: Not on file   Number of children: Not on file   Years of education: Not on file   Highest education level: Bachelor's degree (e.g., BA, AB, BS)  Occupational History   Not on file  Tobacco  Use   Smoking status: Former    Current packs/day: 0.00    Average packs/day: 1 pack/day for 15.0 years (15.0 ttl pk-yrs)    Types: Cigarettes    Start date: 11/08/1984    Quit date: 11/09/1999    Years since quitting: 24.5    Passive exposure: Past   Smokeless tobacco: Never  Vaping Use   Vaping status: Never Used  Substance and Sexual Activity   Alcohol use: Not Currently    Comment: 2 GLASSES OF WINE WITH DINNER    Drug use: No   Sexual activity: Yes    Partners: Male    Birth control/protection: Post-menopausal, Surgical    Comment: 1st intercourse-17, partners-  more than 5,btl  Other Topics Concern   Not on file  Social History Narrative   Married.   No children.   Works as an airline pilot.   Enjoys bowling, play computer games, crossword puzzles   Social Drivers of Health   Financial Resource Strain: Low Risk  (05/06/2024)   Overall Financial Resource Strain (CARDIA)    Difficulty of Paying Living Expenses: Not hard at all  Food Insecurity: No Food Insecurity (05/06/2024)   Hunger Vital Sign    Worried About Running Out of Food in the Last Year: Never true    Ran Out of Food in the Last Year: Never true  Transportation Needs: No Transportation Needs (05/06/2024)   PRAPARE - Administrator, Civil Service (Medical): No    Lack of Transportation (Non-Medical): No  Physical Activity: Inactive (05/06/2024)   Exercise Vital Sign    Days of Exercise per Week: 0 days    Minutes of Exercise per Session: Not on file  Stress: No Stress Concern Present (05/06/2024)   [employer] of Occupational Health - Occupational Stress Questionnaire    Feeling of Stress: Only a little  Social Connections: Socially Integrated (05/06/2024)   Social Connection and Isolation Panel    Frequency of Communication with Friends and Family: Three times a week    Frequency of Social Gatherings with Friends and Family: Once a week    Attends Religious Services: More than 4 times per  year    Active Member of [employer] or Organizations: Yes    Attends Engineer, Structural: More than 4 times per year    Marital Status: Living with partner    Tobacco Counseling Counseling given: Not Answered    Clinical Intake:  Pre-visit preparation completed: Yes  Pain : No/denies pain     BMI - recorded: 36.02 Nutritional Status: BMI > 30  Obese Nutritional Risks: None Diabetes: No  Lab Results  Component Value Date  HGBA1C 5.4 01/09/2020   HGBA1C 5.6 12/16/2018   HGBA1C 5.6 10/02/2017     How often do you need to have someone help you when you read instructions, pamphlets, or other written materials from your doctor or pharmacy?: 1 - Never  Interpreter Needed?: No  Comments: lives with significant other Information entered by :: B.Shadow Schedler,LPN   Activities of Daily Living     05/06/2024    8:27 PM  In your present state of health, do you have any difficulty performing the following activities:  Hearing? 0  Vision? 0  Difficulty concentrating or making decisions? 0  Walking or climbing stairs? 0  Dressing or bathing? 0  Doing errands, shopping? 0  Preparing Food and eating ? N  Using the Toilet? N  In the past six months, have you accidently leaked urine? N  Do you have problems with loss of bowel control? N  Managing your Medications? N  Managing your Finances? N  Housekeeping or managing your Housekeeping? N    Patient Care Team: Gretta Comer POUR, NP as PCP - General (Nurse Practitioner) Patrcia Sharper, MD as Consulting Physician (Ophthalmology)  I have updated your Care Teams any recent Medical Services you may have received from other providers in the past year.     Assessment:   This is a routine wellness examination for [place].  Hearing/Vision screen Hearing Screening - Comments:: Patient denies any hearing difficulties w/rt ear hearing aid (at 80% per pt) Vision Screening - Comments:: Pt says their vision is good with  glasses Dr  Sharper Dayhoff w/no changes in vision   Goals Addressed             This Visit's Progress    DIET - EAT MORE FRUITS AND VEGETABLES       05/07/24     Weight (lb) < 200 lb (90.7 kg)   184 lb 9.6 oz (83.7 kg)    Lose a little and eat healthier as well as exercise more       Depression Screen     05/07/2024    3:52 PM 01/24/2024    2:07 PM 05/07/2023    2:33 PM 02/02/2023    9:12 AM 04/12/2022    2:03 PM 03/11/2021    9:19 AM  PHQ 2/9 Scores  PHQ - 2 Score 1 0 0 0 0 0  PHQ- 9 Score  0  1 0 0    Fall Risk     05/06/2024    8:27 PM 01/24/2024    2:07 PM 12/25/2023    3:09 PM 05/07/2023    2:32 PM 05/06/2023    9:10 PM  Fall Risk   Falls in the past year? 0 1 1 0 1  Number falls in past yr: 0 0 0 0 0  Injury with Fall? 0 0 0 0 0  Risk for fall due to : No Fall Risks No Fall Risks No Fall Risks No Fall Risks   Follow up Falls prevention discussed;Education provided Falls evaluation completed Falls evaluation completed Falls prevention discussed     MEDICARE RISK AT HOME:  Medicare Risk at Home Any stairs in or around the home?: (Patient-Rptd) No Home free of loose throw rugs in walkways, pet beds, electrical cords, etc?: (Patient-Rptd) Yes Adequate lighting in your home to reduce risk of falls?: (Patient-Rptd) Yes Life alert?: (Patient-Rptd) No Use of a cane, walker or w/c?: (Patient-Rptd) No Grab bars in the bathroom?: (Patient-Rptd) No Shower chair or bench in shower?: (  Patient-Rptd) No Elevated toilet seat or a handicapped toilet?: (Patient-Rptd) No  TIMED UP AND GO:  Was the test performed?  Yes  Length of time to ambulate 10 feet: 11 sec Gait steady and fast without use of assistive device  Cognitive Function: 6CIT completed        05/07/2024    3:57 PM 05/07/2023    2:34 PM  6CIT Screen  What Year? 0 points 0 points  What month? 0 points 0 points  What time? 0 points 0 points  Count back from 20 0 points 0 points  Months in reverse  0 points 0 points  Repeat phrase 0 points 0 points  Total Score 0 points 0 points    Immunizations Immunization History  Administered Date(s) Administered   INFLUENZA, HIGH DOSE SEASONAL PF 05/07/2024   Influenza,inj,Quad PF,6+ Mos 04/12/2017, 04/06/2018, 04/20/2019, 05/05/2020, 03/11/2021, 04/12/2022   Influenza-Unspecified 04/12/2017, 04/20/2019   Moderna Sars-Covid-2 Vaccination 09/26/2019, 10/27/2019, 06/20/2020   PNEUMOCOCCAL CONJUGATE-20 04/12/2022   Tdap 11/11/2013   Zoster Recombinant(Shingrix) 04/12/2017, 09/04/2017   Zoster, Live 10/14/2012    Screening Tests Health Maintenance  Topic Date Due   DTaP/Tdap/Td (2 - Td or Tdap) 11/12/2023   COVID-19 Vaccine (4 - 2025-26 season) 03/10/2024   Cervical Cancer Screening (Pap smear)  12/25/2024   Colonoscopy  02/22/2025   Medicare Annual Wellness (AWV)  05/07/2025   Mammogram  05/24/2025   Pneumococcal Vaccine: 50+ Years  Completed   Influenza Vaccine  Completed   DEXA SCAN  Completed   Hepatitis C Screening  Completed   Zoster Vaccines- Shingrix  Completed   Meningococcal B Vaccine  Aged Out    Health Maintenance Items Addressed: Vaccines Given today: Influenza high dose  Additional Screening:  Vision Screening: Recommended annual ophthalmology exams for early detection of glaucoma and other disorders of the eye. Is the patient up to date with their annual eye exam?  Yes  Who is the provider or what is the name of the office in which the patient attends annual eye exams? Dr patrcia  Dental Screening: Recommended annual dental exams for proper oral hygiene  Community Resource Referral / Chronic Care Management: CRR required this visit?  No   CCM required this visit?  No   Plan:    I have personally reviewed and noted the following in the patient's chart:   Medical and social history Use of alcohol, tobacco or illicit drugs  Current medications and supplements including opioid prescriptions. Patient is not  currently taking opioid prescriptions. Functional ability and status Nutritional status Physical activity Advanced directives List of other physicians Hospitalizations, surgeries, and ER visits in previous 12 months Vitals Screenings to include cognitive, depression, and falls Referrals and appointments  In addition, I have reviewed and discussed with patient certain preventive protocols, quality metrics, and best practice recommendations. A written personalized care plan for preventive services as well as general preventive health recommendations were provided to patient.   Erminio LITTIE Saris, LPN   [id number]   After Visit Summary: (In Person-Declined) Patient declined AVS at this time.  Notes: Nothing significant to report at this time.

## 2024-05-07 NOTE — Patient Instructions (Addendum)
 Bianca Lynch,  Thank you for taking the time for your Medicare Wellness Visit. I appreciate your continued commitment to your health goals. Please review the care plan we discussed, and feel free to reach out if I can assist you further.  Medicare recommends these wellness visits once per year to help you and your care team stay ahead of potential health issues. These visits are designed to focus on prevention, allowing your provider to concentrate on managing your acute and chronic conditions during your regular appointments.  Please note that Annual Wellness Visits do not include a physical exam. Some assessments may be limited, especially if the visit was conducted virtually. If needed, we may recommend a separate in-person follow-up with your provider.  Ongoing Care Seeing your primary care provider every 3 to 6 months helps us  monitor your health and provide consistent, personalized care.   Referrals If a referral was made during today's visit and you haven't received any updates within two weeks, please contact the referred provider directly to check on the status.  Recommended Screenings:  Health Maintenance  Topic Date Due   DTaP/Tdap/Td vaccine (2 - Td or Tdap) 11/12/2023   COVID-19 Vaccine (4 - 2025-26 season) 03/10/2024   Pap Smear  12/25/2024   Colon Cancer Screening  02/22/2025   Medicare Annual Wellness Visit  05/07/2025   Breast Cancer Screening  05/24/2025   Pneumococcal Vaccine for age over 76  Completed   Flu Shot  Completed   DEXA scan (bone density measurement)  Completed   Hepatitis C Screening  Completed   Zoster (Shingles) Vaccine  Completed   Meningitis B Vaccine  Aged Out       05/07/2024    3:54 PM  Advanced Directives  Does Patient Have a Medical Advance Directive? Yes  Type of Estate Agent of [place];Living will  Copy of Healthcare Power of Attorney in Chart? No - copy requested   Advance Care Planning is important because  it: Ensures you receive medical care that aligns with your values, goals, and preferences. Provides guidance to your family and loved ones, reducing the emotional burden of decision-making during critical moments.  Vision: Annual vision screenings are recommended for early detection of glaucoma, cataracts, and diabetic retinopathy. These exams can also reveal signs of chronic conditions such as diabetes and high blood pressure.  Dental: Annual dental screenings help detect early signs of oral cancer, gum disease, and other conditions linked to overall health, including heart disease and diabetes.

## 2024-05-15 ENCOUNTER — Encounter: Payer: Self-pay | Admitting: Primary Care

## 2024-05-15 ENCOUNTER — Ambulatory Visit: Admitting: Primary Care

## 2024-05-15 VITALS — BP 130/70 | HR 59 | Temp 97.9°F | Ht 60.25 in | Wt 184.0 lb

## 2024-05-15 DIAGNOSIS — I10 Essential (primary) hypertension: Secondary | ICD-10-CM

## 2024-05-15 DIAGNOSIS — E785 Hyperlipidemia, unspecified: Secondary | ICD-10-CM

## 2024-05-15 DIAGNOSIS — E039 Hypothyroidism, unspecified: Secondary | ICD-10-CM | POA: Diagnosis not present

## 2024-05-15 DIAGNOSIS — G4700 Insomnia, unspecified: Secondary | ICD-10-CM | POA: Diagnosis not present

## 2024-05-15 DIAGNOSIS — F3342 Major depressive disorder, recurrent, in full remission: Secondary | ICD-10-CM

## 2024-05-15 DIAGNOSIS — Z1231 Encounter for screening mammogram for malignant neoplasm of breast: Secondary | ICD-10-CM

## 2024-05-15 DIAGNOSIS — G4733 Obstructive sleep apnea (adult) (pediatric): Secondary | ICD-10-CM | POA: Diagnosis not present

## 2024-05-15 DIAGNOSIS — M25552 Pain in left hip: Secondary | ICD-10-CM

## 2024-05-15 DIAGNOSIS — G8929 Other chronic pain: Secondary | ICD-10-CM

## 2024-05-15 LAB — COMPREHENSIVE METABOLIC PANEL WITH GFR
ALT: 18 U/L (ref 0–35)
AST: 21 U/L (ref 0–37)
Albumin: 4.4 g/dL (ref 3.5–5.2)
Alkaline Phosphatase: 58 U/L (ref 39–117)
BUN: 17 mg/dL (ref 6–23)
CO2: 31 meq/L (ref 19–32)
Calcium: 9.3 mg/dL (ref 8.4–10.5)
Chloride: 98 meq/L (ref 96–112)
Creatinine, Ser: 0.88 mg/dL (ref 0.40–1.20)
GFR: 67.69 mL/min (ref >60.00)
Glucose, Bld: 80 mg/dL (ref 70–99)
Potassium: 4.2 meq/L (ref 3.5–5.1)
Sodium: 137 meq/L (ref 135–145)
Total Bilirubin: 0.7 mg/dL (ref 0.2–1.2)
Total Protein: 6.9 g/dL (ref 6.0–8.3)

## 2024-05-15 LAB — TSH: TSH: 3.39 u[IU]/mL (ref 0.35–5.50)

## 2024-05-15 LAB — LIPID PANEL
Cholesterol: 170 mg/dL (ref 0–200)
HDL: 53.5 mg/dL (ref >39.00)
LDL Cholesterol: 82 mg/dL (ref 0–99)
NonHDL: 116.18
Total CHOL/HDL Ratio: 3
Triglycerides: 172 mg/dL — ABNORMAL HIGH (ref 0.0–149.0)
VLDL: 34.4 mg/dL (ref 0.0–40.0)

## 2024-05-15 MED ORDER — PREDNISONE 20 MG PO TABS: ORAL_TABLET | ORAL | 0 refills | Status: AC

## 2024-05-15 NOTE — Assessment & Plan Note (Signed)
 Controlled.  We discussed the option for weaning off and discontinuing citalopram .  She kindly declines at this time. Continue citalopram  20 mg daily.

## 2024-05-15 NOTE — Assessment & Plan Note (Signed)
 Recently returned.  Will trial another course of prednisone  as this was effective previously. We also discussed physical therapy if pain returns. Also consider meloxicam as needed in the future if needed.  Reviewed x-ray from July 2025.

## 2024-05-15 NOTE — Assessment & Plan Note (Signed)
She is taking levothyroxine correctly.   Continue levothyroxine 112 mcg daily.  Repeat TSH pending. 

## 2024-05-15 NOTE — Assessment & Plan Note (Signed)
Continue CPAP nightly. °

## 2024-05-15 NOTE — Patient Instructions (Signed)
 Stop by the lab prior to leaving today. I will notify you of your results once received.   Start prednisone  tablets.  Take 3 tablets by mouth in the morning for 3 days, then 2 tablets for 3 days, then 1 tablet for 3 days  [facility]   It was a pleasure to see you today!

## 2024-05-15 NOTE — Assessment & Plan Note (Signed)
 Controlled. ? ?Continue lisinopril 10 mg daily. ?CMP pending. ?

## 2024-05-15 NOTE — Progress Notes (Signed)
 Subjective:    Patient ID: Bianca Lynch, female    DOB: [birth year], 68 y.o.   MRN: [id number]  Bianca Lynch is a very pleasant 68 y.o. female with a history of OSA, hypertension, hypothyroidism, osteopenia, depression, hyperlipidemia who presents today for follow-up of chronic conditions   Immunizations: -Influenza: Completed this season  -Shingles: Completed Shingrix series -Pneumonia: Completed prevnar 20 in 2023 Mammogram: Completed in November 2024 Bone Density Scan: Completed in April 2024 PAP: June 2025 per GYN  Colonoscopy: Completed in 2016, due 2026   1) Hypertension/Hyperlipidemia: Currently managed on atorvastatin  40 mg daily, lisinopril  10 mg daily. She denies chest pain, dizziness, headaches.   BP Readings from Last 3 Encounters:  05/15/24 130/70  05/07/24 124/78  01/24/24 (!) 150/82     2) Depression/Insomnia: Currently managed on citalopram  20 mg daily. Feels well managed on her current regimen. She has no concerns.   3) Hypothyroidism: Currently managed on levothyroxine  112 mcg daily  She is taking levothyroxine  every morning on an empty stomach with water only.   No food or other medications for 30 minutes.   No heartburn medication, iron pills, calcium , vitamin D , or magnesium pills within four hours of taking levothyroxine .   4) Chronic Hip Pain: Chronic since April 2025 and located to the left lateral hip. Evaluated in July 2025, was treated with prednisone  course and symptoms resolved until recently.  Over the last 3 days she has been taking Aleve twice daily with improvement.  She underwent plain films of the left hip in July 2025 which showed mild arthritis.    Review of Systems  Constitutional:  Negative for unexpected weight change.  HENT:  Negative for rhinorrhea.   Respiratory:  Negative for cough and shortness of breath.   Cardiovascular:  Negative for chest pain.  Gastrointestinal:  Negative for constipation and diarrhea.   Genitourinary:  Negative for difficulty urinating.  Musculoskeletal:  Positive for arthralgias.  Skin:  Negative for rash.  Allergic/Immunologic: Negative for environmental allergies.  Neurological:  Negative for dizziness and headaches.  Psychiatric/Behavioral:  The patient is not nervous/anxious.          Past Medical History:  Diagnosis Date   Allergy    seasonal   Chicken pox    COVID-19 02/02/2023   Depression    Eczema    Elevated cholesterol    GERD (gastroesophageal reflux disease)    Hypertension    Insomnia    patient denies this at PAT appt 02/09/15   Meniere disease    Menopausal symptom    HOT FLASHES   Osteopenia    Other abnormal cytological finding of specimen from cervix 11/18/2014   Prediabetes 10/10/2016   Thyroid  disease    Hypothyroid    Social History   Socioeconomic History   Marital status: Legally Separated    Spouse name: Not on file   Number of children: Not on file   Years of education: Not on file   Highest education level: Bachelor's degree (e.g., BA, AB, BS)  Occupational History   Not on file  Tobacco Use   Smoking status: Former    Current packs/day: 0.00    Average packs/day: 1 pack/day for 15.0 years (15.0 ttl pk-yrs)    Types: Cigarettes    Start date: 11/08/1984    Quit date: 11/09/1999    Years since quitting: 24.5    Passive exposure: Past   Smokeless tobacco: Never  Vaping Use   Vaping status: Never Used  Substance and Sexual Activity   Alcohol use: Not Currently    Comment: 2 GLASSES OF WINE WITH DINNER    Drug use: No   Sexual activity: Yes    Partners: Male    Birth control/protection: Post-menopausal, Surgical    Comment: 1st intercourse-17, partners-  more than 5,btl  Other Topics Concern   Not on file  Social History Narrative   Married.   No children.   Works as an airline pilot.   Enjoys bowling, play computer games, crossword puzzles   Social Drivers of Health   Financial Resource Strain: Low Risk   (05/06/2024)   Overall Financial Resource Strain (CARDIA)    Difficulty of Paying Living Expenses: Not hard at all  Food Insecurity: No Food Insecurity (05/06/2024)   Hunger Vital Sign    Worried About Running Out of Food in the Last Year: Never true    Ran Out of Food in the Last Year: Never true  Transportation Needs: No Transportation Needs (05/06/2024)   PRAPARE - Administrator, Civil Service (Medical): No    Lack of Transportation (Non-Medical): No  Physical Activity: Inactive (05/06/2024)   Exercise Vital Sign    Days of Exercise per Week: 0 days    Minutes of Exercise per Session: Not on file  Stress: No Stress Concern Present (05/06/2024)   [employer] of Occupational Health - Occupational Stress Questionnaire    Feeling of Stress: Only a little  Social Connections: Socially Integrated (05/06/2024)   Social Connection and Isolation Panel    Frequency of Communication with Friends and Family: Three times a week    Frequency of Social Gatherings with Friends and Family: Once a week    Attends Religious Services: More than 4 times per year    Active Member of [employer] or Organizations: Yes    Attends Banker Meetings: More than 4 times per year    Marital Status: Living with partner  Intimate Partner Violence: Not At Risk (05/07/2024)   Humiliation, Afraid, Rape, and Kick questionnaire    Fear of Current or Ex-Partner: No    Emotionally Abused: No    Physically Abused: No    Sexually Abused: No    Past Surgical History:  Procedure Laterality Date   Carpel tunnel sugery Bilateral 2024   COLONOSCOPY     TUBAL LIGATION  11/22/1998   [facility]/DR. GOTTSEGEN   WISDOM TOOTH EXTRACTION      Family History  Problem Relation Age of Onset   Hypertension Mother    Heart disease Mother    Cancer Mother        UTERINE   Heart disease Father    Breast cancer Paternal Aunt        age 20   Colon cancer Neg Hx     No Known Allergies  Current  Outpatient Medications on File Prior to Visit  Medication Sig Dispense Refill   atorvastatin  (LIPITOR) 40 MG tablet TAKE 1 TABLET BY MOUTH EVERY DAY FOR CHOLESTEROL 30 tablet 0   Calcium  Carb-Cholecalciferol (CALCIUM  600 + D PO) Take by mouth.     cetirizine (ZYRTEC) 10 MG tablet Take 10 mg by mouth daily.     citalopram  (CELEXA ) 20 MG tablet TAKE 1 TABLET (20 MG TOTAL) BY MOUTH DAILY. FOR DEPRESSION. 30 tablet 0   esomeprazole (NEXIUM) 20 MG capsule Take by mouth.     levothyroxine  (SYNTHROID ) 112 MCG tablet TAKE 1 TABLET BY MOUTH EVERY MORNING ON EMPTY STOMACH WITH  WATER ONLY. NO FOOD/MEDS FOR 30 MINTUES 30 tablet 0   lisinopril  (ZESTRIL ) 10 MG tablet TAKE 1 TABLET BY MOUTH EVERY DAY FOR BLOOD PRESSURE 30 tablet 0   Multiple Vitamins-Minerals (CENTRUM SILVER PO) Take by mouth.     No current facility-administered medications on file prior to visit.    BP 130/70   Pulse (!) 59   Temp 97.9 F (36.6 C) (Oral)   Ht 5' 0.25 (1.53 m)   Wt 184 lb (83.5 kg)   SpO2 98%   BMI 35.64 kg/m  Objective:   Physical Exam Eyes:     Extraocular Movements: Extraocular movements intact.  Cardiovascular:     Rate and Rhythm: Normal rate and regular rhythm.  Pulmonary:     Effort: Pulmonary effort is normal.     Breath sounds: Normal breath sounds.  Abdominal:     Palpations: Abdomen is soft.     Tenderness: There is no abdominal tenderness.  Musculoskeletal:     Cervical back: Neck supple.  Skin:    General: Skin is warm and dry.  Neurological:     Mental Status: She is alert and oriented to person, place, and time.  Psychiatric:        Mood and Affect: Mood normal.     Physical Exam        Assessment & Plan:  Essential hypertension Assessment & Plan: Controlled.  Continue lisinopril  10 mg daily. CMP pending.  Orders: -     Comprehensive metabolic panel with GFR  OSA (obstructive sleep apnea) Assessment & Plan: Continue CPAP nightly.   Hypothyroidism, unspecified  type Assessment & Plan: She is taking levothyroxine  correctly.  Continue levothyroxine  112 mcg daily. Repeat TSH pending.  Orders: -     TSH  Insomnia, unspecified type Assessment & Plan: Continue citalopram  20 mg daily.   Hyperlipidemia, unspecified hyperlipidemia type Assessment & Plan: Repeat lipid panel pending. Continue atorvastatin  40 mg daily.  Orders: -     Lipid panel -     Comprehensive metabolic panel with GFR  Recurrent major depressive disorder, in full remission Assessment & Plan: Controlled.  We discussed the option for weaning off and discontinuing citalopram .  She kindly declines at this time. Continue citalopram  20 mg daily.   Chronic hip pain, left Assessment & Plan: Recently returned.  Will trial another course of prednisone  as this was effective previously. We also discussed physical therapy if pain returns. Also consider meloxicam as needed in the future if needed.  Reviewed x-ray from July 2025.  Orders: -     predniSONE ; Take 3 tablets by mouth daily x 3 days, then 2 tablets x 3 days, then 1 tablet for 3 days.  Dispense: 18 tablet; Refill: 0  Screening mammogram for breast cancer -     3D Screening Mammogram, Left and Right; Future    Assessment and Plan Assessment & Plan         Comer MARLA Gaskins, NP     History of Present Illness

## 2024-05-15 NOTE — Assessment & Plan Note (Signed)
 Continue citalopram 20 mg daily

## 2024-05-15 NOTE — Assessment & Plan Note (Signed)
 Repeat lipid panel pending. Continue atorvastatin 40 mg daily.

## 2024-05-16 ENCOUNTER — Ambulatory Visit: Payer: Self-pay | Admitting: Primary Care

## 2024-05-25 ENCOUNTER — Other Ambulatory Visit: Payer: Self-pay | Admitting: Primary Care

## 2024-05-25 DIAGNOSIS — E785 Hyperlipidemia, unspecified: Secondary | ICD-10-CM

## 2024-05-25 DIAGNOSIS — F3342 Major depressive disorder, recurrent, in full remission: Secondary | ICD-10-CM

## 2024-05-25 DIAGNOSIS — I10 Essential (primary) hypertension: Secondary | ICD-10-CM

## 2024-05-29 ENCOUNTER — Other Ambulatory Visit: Payer: Self-pay | Admitting: Primary Care

## 2024-05-29 DIAGNOSIS — E039 Hypothyroidism, unspecified: Secondary | ICD-10-CM

## 2024-06-09 ENCOUNTER — Ambulatory Visit
Admission: RE | Admit: 2024-06-09 | Discharge: 2024-06-09 | Disposition: A | Source: Ambulatory Visit | Attending: Primary Care

## 2024-06-09 DIAGNOSIS — Z1231 Encounter for screening mammogram for malignant neoplasm of breast: Secondary | ICD-10-CM

## 2024-06-10 ENCOUNTER — Ambulatory Visit

## 2024-06-25 DIAGNOSIS — G8929 Other chronic pain: Secondary | ICD-10-CM

## 2024-07-23 ENCOUNTER — Other Ambulatory Visit: Payer: Self-pay | Admitting: Radiology

## 2024-07-23 ENCOUNTER — Ambulatory Visit: Admitting: Orthopaedic Surgery

## 2024-07-23 VITALS — Ht 60.24 in | Wt 184.8 lb

## 2024-07-23 DIAGNOSIS — M25552 Pain in left hip: Secondary | ICD-10-CM | POA: Diagnosis not present

## 2024-07-23 NOTE — Progress Notes (Signed)
 The patient is a 69 year old female that I am seeing for the first time for left hip pain.  This has been actually going on since July of this past year.  Hip can become sharp at times and it is in the groin only.  At times it is become a constant ache.  It is difficult walking up and down stairs and getting up and down.  She denies any back issues.  She denies any radicular pain and denies any knee pain.  She has never had any type of knee surgery.  She has tried activity modification as well as anti-inflammatories.  She is not a diabetic and not on blood thinning medication.  She has been on a steroid as well.  On exam her left hip hurts quite a bit with internal and external rotation with pain in the groin only.  There is no blocks to rotation but is certainly painful.  X-rays on the canopy system of her left hip and pelvis shows that the joint space is only just slightly narrow.  There is no deformities of the femoral head.  Given the fact that her pain is worsening with her left hip, a MRI of her left hip is warranted to assess the cartilage of the hip itself and to look for any type of degenerative changes in the labrum.  She agrees with this treatment plan.  Will see her back after the MRI.

## 2024-07-25 ENCOUNTER — Ambulatory Visit
Admission: RE | Admit: 2024-07-25 | Discharge: 2024-07-25 | Disposition: A | Discharge status: Home / Self Care | Source: Ambulatory Visit | Attending: Orthopaedic Surgery | Admitting: Orthopaedic Surgery

## 2024-07-25 DIAGNOSIS — M25552 Pain in left hip: Secondary | ICD-10-CM

## 2024-08-25 ENCOUNTER — Ambulatory Visit: Admitting: Orthopaedic Surgery

## 2025-05-08 ENCOUNTER — Ambulatory Visit
